# Patient Record
Sex: Female | Born: 2000 | Race: Black or African American | Hispanic: No | Marital: Single | Smoking: Never smoker
Health system: Southern US, Community
[De-identification: ages and names within clinical notes are randomized; demographics above are authoritative.]

## PROBLEM LIST (undated history)

## (undated) ENCOUNTER — Inpatient Hospital Stay (HOSPITAL_COMMUNITY): Payer: Self-pay

## (undated) DIAGNOSIS — F419 Anxiety disorder, unspecified: Secondary | ICD-10-CM

## (undated) DIAGNOSIS — F32A Depression, unspecified: Secondary | ICD-10-CM

## (undated) DIAGNOSIS — D649 Anemia, unspecified: Secondary | ICD-10-CM

## (undated) DIAGNOSIS — F909 Attention-deficit hyperactivity disorder, unspecified type: Secondary | ICD-10-CM

## (undated) HISTORY — DX: Depression, unspecified: F32.A

## (undated) HISTORY — DX: Anxiety disorder, unspecified: F41.9

## (undated) HISTORY — PX: NO PAST SURGERIES: SHX2092

---
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
## 2021-01-02 ENCOUNTER — Encounter: Payer: Self-pay | Admitting: *Deleted

## 2021-01-02 DIAGNOSIS — Z3401 Encounter for supervision of normal first pregnancy, first trimester: Secondary | ICD-10-CM

## 2021-01-09 ENCOUNTER — Encounter: Payer: Self-pay | Admitting: *Deleted

## 2021-01-09 DIAGNOSIS — Z3401 Encounter for supervision of normal first pregnancy, first trimester: Secondary | ICD-10-CM

## 2021-01-24 ENCOUNTER — Telehealth: Payer: Self-pay | Admitting: *Deleted

## 2021-01-24 ENCOUNTER — Other Ambulatory Visit: Payer: Self-pay

## 2021-01-24 ENCOUNTER — Ambulatory Visit (INDEPENDENT_AMBULATORY_CARE_PROVIDER_SITE_OTHER): Payer: Medicaid Other | Admitting: Advanced Practice Midwife

## 2021-01-24 VITALS — BP 123/73 | HR 101 | Wt 130.0 lb

## 2021-01-24 DIAGNOSIS — Z3A17 17 weeks gestation of pregnancy: Secondary | ICD-10-CM

## 2021-01-24 DIAGNOSIS — Z3A19 19 weeks gestation of pregnancy: Secondary | ICD-10-CM

## 2021-01-24 DIAGNOSIS — Z3401 Encounter for supervision of normal first pregnancy, first trimester: Secondary | ICD-10-CM | POA: Diagnosis not present

## 2021-01-24 DIAGNOSIS — Z609 Problem related to social environment, unspecified: Secondary | ICD-10-CM

## 2021-01-24 NOTE — Telephone Encounter (Signed)
Returned call from 12:14 PM, office at lunch. Left a message with appointment information.
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
## 2021-01-25 ENCOUNTER — Ambulatory Visit (INDEPENDENT_AMBULATORY_CARE_PROVIDER_SITE_OTHER): Payer: Medicaid Other | Admitting: Licensed Clinical Social Worker

## 2021-01-25 DIAGNOSIS — Z609 Problem related to social environment, unspecified: Secondary | ICD-10-CM | POA: Diagnosis not present

## 2021-01-25 DIAGNOSIS — Z59819 Housing instability, housed unspecified: Secondary | ICD-10-CM | POA: Diagnosis not present
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
## 2021-02-13 ENCOUNTER — Other Ambulatory Visit: Payer: Self-pay | Admitting: Advanced Practice Midwife

## 2021-02-13 ENCOUNTER — Other Ambulatory Visit: Payer: Self-pay | Admitting: *Deleted

## 2021-02-13 ENCOUNTER — Ambulatory Visit: Payer: Medicaid Other | Attending: Advanced Practice Midwife

## 2021-02-13 ENCOUNTER — Other Ambulatory Visit: Payer: Self-pay

## 2021-02-13 DIAGNOSIS — Z3A19 19 weeks gestation of pregnancy: Secondary | ICD-10-CM | POA: Insufficient documentation

## 2021-02-13 DIAGNOSIS — Z3401 Encounter for supervision of normal first pregnancy, first trimester: Secondary | ICD-10-CM | POA: Insufficient documentation

## 2021-02-13 DIAGNOSIS — Z362 Encounter for other antenatal screening follow-up: Secondary | ICD-10-CM

## 2021-02-13 DIAGNOSIS — O283 Abnormal ultrasonic finding on antenatal screening of mother: Secondary | ICD-10-CM

## 2021-02-21 ENCOUNTER — Ambulatory Visit (INDEPENDENT_AMBULATORY_CARE_PROVIDER_SITE_OTHER): Payer: Medicaid Other | Admitting: Obstetrics and Gynecology

## 2021-02-21 ENCOUNTER — Other Ambulatory Visit: Payer: Self-pay

## 2021-02-21 DIAGNOSIS — Z3401 Encounter for supervision of normal first pregnancy, first trimester: Secondary | ICD-10-CM | POA: Diagnosis not present

## 2021-02-21 DIAGNOSIS — Z3A21 21 weeks gestation of pregnancy: Secondary | ICD-10-CM | POA: Diagnosis not present

## 2021-02-21 MED ORDER — ONDANSETRON 8 MG PO TBDP: 8.0000 mg | ORAL_TABLET | Freq: Three times a day (TID) | ORAL | 0 refills | Status: DC | PRN

## 2021-02-21 NOTE — Progress Notes (Signed)
   PRENATAL VISIT NOTE  Subjective:  Melissa Navarro is a 20 y.o. G1P0 at [redacted]w[redacted]d being seen today for ongoing prenatal care.  She is currently monitored for the following issues for this high-risk pregnancy and has MDD (major depressive disorder), recurrent severe, without psychosis (HCC); Attention deficit hyperactivity disorder (ADHD); Impulsiveness; Mood disorder (HCC); Encounter for supervision of normal first pregnancy in first trimester; Borderline personality disorder (HCC); and Continuous cannabis use on their problem list.  Patient reports no complaints.  Contractions: Not present. Vag. Bleeding: None.  Movement: Present. Denies leaking of fluid.   The following portions of the patient's history were reviewed and updated as appropriate: allergies, current medications, past family history, past medical history, past social history, past surgical history and problem list.   Objective:   Vitals:   02/21/21 1454  BP: 110/64  Pulse: 80  Weight: 132 lb (59.9 kg)    Fetal Status: Fetal Heart Rate (bpm): 156   Movement: Present     General:  Alert, oriented and cooperative. Patient is in no acute distress.  Skin: Skin is warm and dry. No rash noted.   Cardiovascular: Normal heart rate noted  Respiratory: Normal respiratory effort, no problems with respiration noted  Abdomen: Soft, gravid, appropriate for gestational age.  Pain/Pressure: Absent     Pelvic: Cervical exam deferred        Extremities: Normal range of motion.  Edema: None  Mental Status: Normal mood and affect. Normal behavior. Normal judgment and thought content.   Assessment and Plan:  Pregnancy: G1P0 at [redacted]w[redacted]d 1. Encounter for supervision of normal first pregnancy in first trimester  - partner Myrtha Mantis present today. Strong marijuana smell in room. Back and fourth "bickering" between partner and patient throughout visit.  - Alpha fetoprotein, maternal - Lives with 'home girl" in [place]. She Is not planning to stay  there until baby is born.  She is Planning to move to [place] to live with Aunt. Planning to leave today or tomorrow via train. She plans to start OB when she arrives to [place].  Preterm labor symptoms and general obstetric precautions including but not limited to vaginal bleeding, contractions, leaking of fluid and fetal movement were reviewed in detail with the patient. Please refer to After Visit Summary for other counseling recommendations.   No follow-ups on file.  Future Appointments  Date Time Provider Department Center  03/15/2021  3:30 PM [facility] NURSE [facility]  03/15/2021  3:45 PM [facility] US6 [facility]    Venia Carbon, NP

## 2021-02-22 LAB — ALPHA FETOPROTEIN, MATERNAL
AFP MoM: 1.08
AFP, Serum: 81.3 ng/mL
Calc'd Gestational Age: 21 weeks
Maternal Wt: 132 [lb_av]
Risk for ONTD: <1
Twins-AFP: 1

## 2021-03-15 ENCOUNTER — Ambulatory Visit: Payer: Medicaid Other

## 2021-03-15 ENCOUNTER — Ambulatory Visit: Payer: Medicaid Other | Attending: Obstetrics and Gynecology

## 2021-03-16 DIAGNOSIS — Z419 Encounter for procedure for purposes other than remedying health state, unspecified: Secondary | ICD-10-CM | POA: Diagnosis not present

## 2021-04-16 DIAGNOSIS — Z419 Encounter for procedure for purposes other than remedying health state, unspecified: Secondary | ICD-10-CM | POA: Diagnosis not present

## 2021-04-16 NOTE — L&D Delivery Note (Signed)
Delivery Note ?At 8:07 AM a viable and healthy female was delivered via Vaginal, Spontaneous (Presentation: Left Occiput Anterior).  APGAR: 8, 9; weight 6 lb 2.1 oz (2780 g).   ?Placenta status: Spontaneous, Intact.  Cord: 3 vessels with the following complications: None.   ? ?Anesthesia: None ?Episiotomy: None ?Lacerations: Labial, superficial, hemostatic ?Suture Repair:  n/a ?Est. Blood Loss (mL): 250 ? ?Mom to postpartum.  Baby to Couplet care / Skin to Skin. ? ?Melissa Navarro is a 21 y.o. female G1P1001 with IUP at [redacted]w[redacted]d admitted for active labor .  She progressed with AROM only for augmentation to complete and pushed ~ 20 minutes to deliver.  Cord clamping delayed by 1-3 minutes then clamped by CNM and cut by FOB.  Placenta intact and spontaneous, EBL normal but boggy uterus with increased bleeding initially so pt received Cytotec 800 mcg rectally and TXA via IV in addition to routine Pitocin IV.  Very superficial lacerations on bilateral labia, hemostatic, approximate well and not repaired.  Mom and baby stable prior to transfer to postpartum. She plans on breastfeeding. She requests IUD or Depo for birth control.  ? ?Sharen Counter ?06/29/2021, 9:28 AM ? ? ? ?

## 2021-05-01 DIAGNOSIS — O99891 Other specified diseases and conditions complicating pregnancy: Secondary | ICD-10-CM | POA: Diagnosis not present

## 2021-05-01 DIAGNOSIS — R55 Syncope and collapse: Secondary | ICD-10-CM | POA: Diagnosis not present

## 2021-05-01 DIAGNOSIS — O99323 Drug use complicating pregnancy, third trimester: Secondary | ICD-10-CM | POA: Diagnosis not present

## 2021-05-01 DIAGNOSIS — F1290 Cannabis use, unspecified, uncomplicated: Secondary | ICD-10-CM | POA: Diagnosis not present

## 2021-05-01 DIAGNOSIS — R42 Dizziness and giddiness: Secondary | ICD-10-CM | POA: Diagnosis not present

## 2021-05-01 DIAGNOSIS — O2343 Unspecified infection of urinary tract in pregnancy, third trimester: Secondary | ICD-10-CM | POA: Diagnosis not present

## 2021-05-01 DIAGNOSIS — Z3A31 31 weeks gestation of pregnancy: Secondary | ICD-10-CM | POA: Diagnosis not present

## 2021-05-02 ENCOUNTER — Telehealth: Payer: Self-pay

## 2021-05-02 NOTE — Telephone Encounter (Signed)
Transition Care Management Follow-up Telephone Call Date of discharge and from where: 05/01/2021 from [facility] How have you been since you were released from the hospital? Pt stated that she is feeling well.  Any questions or concerns? No  Patient has moved to [place].  Patient stated that she needs her records sent to new provider.  Gave patient HIM number: [phone]  Fax: [phone]

## 2021-05-03 ENCOUNTER — Telehealth: Payer: Self-pay | Admitting: *Deleted

## 2021-05-03 DIAGNOSIS — Z130 Encounter for screening for diseases of the blood and blood-forming organs and certain disorders involving the immune mechanism: Secondary | ICD-10-CM | POA: Diagnosis not present

## 2021-05-03 DIAGNOSIS — Z3480 Encounter for supervision of other normal pregnancy, unspecified trimester: Secondary | ICD-10-CM | POA: Diagnosis not present

## 2021-05-03 DIAGNOSIS — R030 Elevated blood-pressure reading, without diagnosis of hypertension: Secondary | ICD-10-CM | POA: Diagnosis not present

## 2021-05-03 NOTE — Telephone Encounter (Signed)
Left patient an urgent message to call the office to give updated information. Patient will be seen by the [facility] Department today, 05/03/2021.

## 2021-05-12 DIAGNOSIS — Z23 Encounter for immunization: Secondary | ICD-10-CM | POA: Diagnosis not present

## 2021-05-12 DIAGNOSIS — Z1389 Encounter for screening for other disorder: Secondary | ICD-10-CM | POA: Diagnosis not present

## 2021-05-12 DIAGNOSIS — O99343 Other mental disorders complicating pregnancy, third trimester: Secondary | ICD-10-CM | POA: Diagnosis not present

## 2021-05-12 DIAGNOSIS — Z3403 Encounter for supervision of normal first pregnancy, third trimester: Secondary | ICD-10-CM | POA: Diagnosis not present

## 2021-05-17 DIAGNOSIS — Z419 Encounter for procedure for purposes other than remedying health state, unspecified: Secondary | ICD-10-CM | POA: Diagnosis not present

## 2021-06-06 ENCOUNTER — Other Ambulatory Visit (HOSPITAL_COMMUNITY)
Admission: RE | Admit: 2021-06-06 | Discharge: 2021-06-06 | Disposition: A | Discharge status: Home / Self Care | Payer: Medicaid Other | Source: Ambulatory Visit | Attending: Advanced Practice Midwife | Admitting: Advanced Practice Midwife

## 2021-06-06 ENCOUNTER — Ambulatory Visit (INDEPENDENT_AMBULATORY_CARE_PROVIDER_SITE_OTHER): Payer: Medicaid Other | Admitting: Advanced Practice Midwife

## 2021-06-06 ENCOUNTER — Other Ambulatory Visit: Payer: Self-pay

## 2021-06-06 VITALS — BP 104/65 | HR 76 | Wt 157.0 lb

## 2021-06-06 DIAGNOSIS — Z3A36 36 weeks gestation of pregnancy: Secondary | ICD-10-CM

## 2021-06-06 DIAGNOSIS — Z3401 Encounter for supervision of normal first pregnancy, first trimester: Secondary | ICD-10-CM | POA: Diagnosis not present

## 2021-06-06 DIAGNOSIS — Z609 Problem related to social environment, unspecified: Secondary | ICD-10-CM

## 2021-06-06 LAB — OB RESULTS CONSOLE GBS: GBS: NEGATIVE

## 2021-06-06 NOTE — Patient Instructions (Signed)
Labor Precautions Reasons to come to [facility]U at [facility]:  1.  Contractions are  5 minutes apart or less, each last 1 minute, these have been going on for 1-2 hours, and you cannot walk or talk during them 2.  You have a large gush of fluid, or a trickle of fluid that will not stop and you have to wear a pad 3.  You have bleeding that is bright red, heavier than spotting--like menstrual bleeding (spotting can be normal in early labor or after a check of your cervix) 4.  You do not feel the baby moving like he/she normally does  

## 2021-06-06 NOTE — Progress Notes (Signed)
° ° °  PRENATAL VISIT NOTE  Subjective:  Melissa Navarro is a 21 y.o. G1P0 at [redacted]w[redacted]d being seen today for ongoing prenatal care.  She is currently monitored for the following issues for this low-risk pregnancy and has MDD (major depressive disorder), recurrent severe, without psychosis ([place]); Attention deficit hyperactivity disorder (ADHD); Impulsiveness; Mood disorder ([place]); Encounter for supervision of normal first pregnancy in first trimester; Borderline personality disorder ([place]); and Continuous cannabis use on their problem list.  Patient reports occasional contractions.  Contractions: Irritability. Vag. Bleeding: None.  Movement: Present. Denies leaking of fluid.   The following portions of the patient's history were reviewed and updated as appropriate: allergies, current medications, past family history, past medical history, past social history, past surgical history and problem list.   Objective:   Vitals:   06/06/21 1552  BP: 104/65  Pulse: 76  Weight: 157 lb (71.2 kg)    Fetal Status: Fetal Heart Rate (bpm): 140 Fundal Height: 35 cm Movement: Present  Presentation: Vertex  General:  Alert, oriented and cooperative. Patient is in no acute distress.  Skin: Skin is warm and dry. No rash noted.   Cardiovascular: Normal heart rate noted  Respiratory: Normal respiratory effort, no problems with respiration noted  Abdomen: Soft, gravid, appropriate for gestational age.  Pain/Pressure: Present     Pelvic: Cervical exam performed in the presence of a chaperone Dilation: Closed Effacement (%): 80 Station: -2  Extremities: Normal range of motion.  Edema: Trace  Mental Status: Normal mood and affect. Normal behavior. Normal judgment and thought content.   Assessment and Plan:  Pregnancy: G1P0 at [redacted]w[redacted]d 1. Encounter for supervision of normal first pregnancy in first trimester --Anticipatory guidance about next visits/weeks of pregnancy given.  --Next visit in 1 week  --GBS/GCC collected  today  2. High risk social situation --Pt reports being safe where she lives now, here with her mother for appt  3. [redacted] weeks gestation of pregnancy   Term labor symptoms and general obstetric precautions including but not limited to vaginal bleeding, contractions, leaking of fluid and fetal movement were reviewed in detail with the patient. Please refer to After Visit Summary for other counseling recommendations.   Return in about 1 week (around 06/13/2021).  Future Appointments  Date Time Provider [place]  06/13/2021  4:10 PM Constant, Vickii Chafe, MD [facility]     Fatima Blank, CNM

## 2021-06-07 ENCOUNTER — Telehealth: Payer: Self-pay | Admitting: Advanced Practice Midwife

## 2021-06-07 DIAGNOSIS — A568 Sexually transmitted chlamydial infection of other sites: Secondary | ICD-10-CM

## 2021-06-07 LAB — CERVICOVAGINAL ANCILLARY ONLY
Chlamydia: POSITIVE — AB
Comment: NEGATIVE
Comment: NORMAL
Neisseria Gonorrhea: NEGATIVE

## 2021-06-07 MED ORDER — AZITHROMYCIN 500 MG PO TABS: 1000.0000 mg | ORAL_TABLET | Freq: Once | ORAL | 1 refills | Status: AC

## 2021-06-07 NOTE — Telephone Encounter (Signed)
Called pt to discuss positive vaginal swab for chlamydia on 06/06/21.  Questions answered.  Offered partner therapy, pt will need to pick up in the office if desired.  Rx for azithromycin 1000 mg x 1 [facility]  TOC in 3 weeks.

## 2021-06-09 LAB — CULTURE, BETA STREP (GROUP B ONLY)
MICRO NUMBER:: 13040543
SPECIMEN QUALITY:: ADEQUATE

## 2021-06-11 ENCOUNTER — Encounter: Payer: Self-pay | Admitting: *Deleted

## 2021-06-13 ENCOUNTER — Encounter: Payer: Self-pay | Admitting: Obstetrics and Gynecology

## 2021-06-13 ENCOUNTER — Ambulatory Visit (INDEPENDENT_AMBULATORY_CARE_PROVIDER_SITE_OTHER): Payer: Medicaid Other | Admitting: Obstetrics and Gynecology

## 2021-06-13 ENCOUNTER — Other Ambulatory Visit: Payer: Self-pay

## 2021-06-13 VITALS — BP 108/66 | HR 85 | Wt 158.0 lb

## 2021-06-13 DIAGNOSIS — O98813 Other maternal infectious and parasitic diseases complicating pregnancy, third trimester: Secondary | ICD-10-CM

## 2021-06-13 DIAGNOSIS — A749 Chlamydial infection, unspecified: Secondary | ICD-10-CM

## 2021-06-13 DIAGNOSIS — Z3401 Encounter for supervision of normal first pregnancy, first trimester: Secondary | ICD-10-CM

## 2021-06-13 NOTE — Patient Instructions (Signed)
AREA PEDIATRIC/FAMILY PRACTICE PHYSICIANS  [place] ([address])  Family Medicine Center Chambliss, MD; Eniola, MD; Hale, MD; Hensel, MD; McDiarmid, MD; McIntyer, MD; Neal, MD; Walden, MD [address] [place], [place] [address] [phone] Mon-Fri 8:30-12:30, 1:30-5:00 Providers come to see babies at [facility] Accepting Medicaid [facility] providers who accept newborns: Koirala, MD; Morrow, MD; Wolters, MD [address], [place], [place] [address] [phone] Mon-Fri 8:00-5:30 Babies seen by providers at [facility] Does NOT accept Medicaid Please call early in hospitalization for appointment (limited availability)  [facility] Mulberry, MD [address] [place], [place] [address] [phone] Mon, Tue, Thur, Fri 8:30-5:00, Wed 10:00-7:00 (closed 1-2pm) Babies seen by [facility] providers Accepting Medicaid Rub[facility] - Pediatrician Rubin, MD [address], [place], [place] [address] [phone] Mon-Fri 8:30-5:00, Sat 8:30-12:00 Provider comes to see babies at [facility] Accepting Medicaid Must have been referred from current patients or contacted office prior to delivery [facility] ([facility]) Brown, MD; Chandler, MD; Ettefagh, MD; Grant, MD; Lester, MD; McCormick, MD; McQueen, MD; Prose, MD; Simha, MD; Stanley, MD; Stryffeler, NP; Tebben, NP [address], [place], [place] [address] [phone] Mon, Tue, Thur, Fri 8:30-5:30, Wed 9:30-5:30, Sat 8:30-12:30 Babies seen by [facility] providers Accepting Medicaid Only accepting infants of first-time parents or siblings of current patients Hospital discharge coordinator will make follow-up appointment Jack Amos [address], [place], [place]  [address] [phone]   Fax - [phone] [facility] [address], [place], [place]  [address] Phone - [phone]   Fax - [phone] Shilpa Gosrani [address], [place], [place]  [address] [phone]  [place] ([address]) [facility] Bates, MD; [place], MD; Cooper, Cox, MD; MD; Davis, MD; Dovico, MD; Ettefaugh, MD; Little, MD; Lowe, MD; Keiffer, MD; Melvin, MD; Sumner, MD; Williams, MD [address], [place], [place] [address] [phone] Mon-Fri 8:30-5:00 (extended evenings Mon-Thur as needed), Sat-Sun 10:00-1:00 Providers come to see babies at [facility] Accepting Medicaid for families of first-time babies and families with all children in the household age 3 and under. Must register with office prior to making appointment (M-F only). [facility] Henson, NP; Knapp, MD; Lalonde, MD; Tysinger, PA [address] [place], [place] [address] [phone] Mon-Fri 8:00-5:00 Babies seen by providers at [facility] Does NOT accept Medicaid/Commercial Insurance Only [facility] ([facility])  Artis, MD; Barnes, MD; Bratton, MD; Coccaro, MD; Lock[place] Gardner, MD; Kramer, MD; Marshall, MD; Netherton, MD; Poleto, MD; Skinner, MD [address] [place], [place] [address] [phone] Mon-Fri 8:30-5:30, Sat (Oct.-Mar.) 9:00-1:00 Babies seen by providers at [facility] Accepting Medicaid  [place] ([address]) [facility] Reid, MD; Warner, MD [address], [place], [place] [address] [phone] Mon-Fri 8:30-5:00, Sat 8:30-12:00 Providers come to see babies at [facility] Does NOT accept Medicaid [facility] Becker, PA; Hagler, MD; Scifres, PA; Sun, MD; Swayne, MD [address], , [place] [address] [phone] Mon-Fri 8:00-5:00 Babies seen by providers at [facility] Does NOT accept Medicaid Only accepting babies of parents who  are patients Please call early in hospitalization for appointment (limited availability)  Pediatricians Clark, MD; Frye, MD; Kelleher, MD; Mack, NP; Miller, MD; O'Keller, MD; Patterson, NP; Pudlo, MD; Puzio, MD; Thomas, MD; Tucker, MD; Twiselton, MD [address], [place], [place] [address] [phone] Mon-Fri 8:00-5:00, Sat 9:00-12:00 Providers come to see babies at [facility] Does NOT accept Medicaid  [place] ([address]) [facility] providers accepting new patients: Brake, NP; Wharton, PA [address], [place], [place] [address] [phone] Mon-Fri 8:00-5:00 Babies seen by providers at [facility] Does NOT accept Medicaid Only accepting babies of parents who are patients Please call early in hospitalization for appointment (limited availability) [facility] Gay, MD; Quinlan, MD [address] [place], [place] [address] [phone] (press 1 to schedule appointment) Mon-Fri 8:00-5:00 Providers come to see babies at [facility] Does NOT accept Medicaid [facility] Mazer, MD [address] [place], [place] [address] [phone] Mon-Fri 8:30-5:00 (lunch 12:30-1:00), extended hours by appointment only Wed 5:00-6:30 Babies seen by [facility] providers Accepting Medicaid [facility] Banks, MD; Jordan, MD; Koberlein, MD [address], [place], [place] [address] [phone] Mon-Fri 8:00-5:00 Babies seen by [facility] providers Does NOT accept Medicaid [facility] Parker, MD; Hunter, MD; Wallace, DO [address] [place], [place] [address] [phone] Mon-Fri 8:00-5:00 Babies seen by [facility] providers Does NOT accept Medicaid [facility] Brandon, PA; Brecken, PA; Christy, NP; Dees, MD; DeClaire, MD; DeWeese, MD; Hansen, NP; Mills, NP; Parrish, NP; Smoot, NP; Summer, MD; Vapne,  MD [address] [place], [place] [address] [phone] Mon-Fri 8:30-5:00, Sat 10:00-1:00 Providers come to see babies at [facility] Does NOT accept Medicaid Free prenatal information session Tuesdays at 4:45pm [facility] Bouska, MD; Gordon, PA; Jeffery, PA; Weber, PA [address] [place] [address] [phone] Mon-Fri 7:30-5:30 Babies seen by [facility] providers [facility] [address], [place], [place]  [address] [phone]   Fax - [phone]  [place] ([address] & [address]) [facility] [place] [address] [place], [place] [address] [phone] Mon-Thur 8:00-6:00 Providers come to see babies at [facility] Accepting Medicaid [facility] Anderson, NP; Badger, MD; Beal, PA; Spencer, PA [address] [place], [place] [address] [phone] Mon-Thur 7:30-7:30, Fri 7:30-4:30 Babies seen by [facility] providers Accepting Medicaid [facility] Agbuya, MD; Klett, NP; Romgoolam, MD [address], [place], [place] [address] [phone] Mon-Fri 8:30-5:00, Sat 8:30-12:00 Providers come to see babies at [facility] Accepting Medicaid Must have "Meet & Greet" appointment at office prior to delivery [facility] ([facility]) McCord, MD; Wallace, MD; Wood, MD [address], [place], [place] [address] [phone] Mon-Wed 8:00-6:00, Thur-Fri 8:00-5:00, Sat 9:00-12:00 Providers come to see babies at [facility] Does NOT accept Medicaid Only accepting siblings of current patients [facility]  [address], [place], [place]  [address] [phone]   Fax - [phone] [facility] [place] [address], [place],   [address] [phone]   Fax -  [phone]  [place] ([address] & [address]) [facility] Cirigliano, DO; Matthews, DO [address] [place],  [address] [phone] Mon-Fri 7:00-5:00 Babies seen by [facility] providers Does NOT accept Medicaid [facility] Briscoe, MD; Howley, PA; Moreira, PA [address], [place],  [address] [phone] Mon-Fri 8:00-5:00 Babies seen by [facility] providers Accepting Medicaid [facility] Boyd, MD; Church, PA; Jones, NP; Osborn, PA [address], ,  [address] [phone] Mon-Fri 8:00-5:00 Babies seen by providers at [facility] Accepting Medicaid  [place] ([address]) [facility] [place] Wendling, DO [address] [place], [place] [address] [phone] Mon-Fri 8:00-5:00 Babies seen by [facility] providers Does NOT accept Medicaid Limited availability, please call early in hospitalization to schedule follow-up [facility] Calderon, PA; Cummings, MD; Dillard, MD; Martin, PA; Olson, MD; VanDeven, PA [address], [place], [place] [address] [phone] Mon-Fri 8:30-5:00, Sat 9:00-12:00 Babies seen by providers at [facility] Accepting Medicaid Please register online then schedule online or call office [email or web address] Hunter, NP; Kumar, MD; Martin Rogers, PA [address], [place], [place] [address] [phone] Mon-Fri 8:00-5:00 Babies seen by providers at [facility] Accepting Medicaid [facility] ([facility]) [place], MD; Kristi Fleenor, NP; West, MD [address], [place], [place] [address] [phone] Mon-Fri 8:00-5:30, Sat&Sun by appointment (phones open at  8:30) Babies seen by [facility] providers Accepting Medicaid Must be a first-time baby or sibling of current patient [facility] [place]  [address], [place], [place]  [address] [phone]   Fax - [phone]  [place] ([address]) [facility] Brown, PA; Cowen, PA; Rice, MD; Helton, PA; Spry, MD [address] [place], [place] [address] [phone] Mon-Thur 8:00-7:00, Fri 8:00-5:00, Sat 8:00-12:00, Sun 9:00-12:00 Babies seen by [facility] providers Accepting Medicaid [facility] Coe-Goins, MD; Marshall, MD; Pierre-Louis, MD [address], [place], [place] [address] [phone] Mon-Thur 8:00-5:00 Babies seen by providers at [facility] Accepting Medicaid [facility] Bratton, MD; Coe-Goins, MD; Hayes, MD; Lewis, MD; List, MD; Lott, MD; Marshall, MD; Moran, MD; O'Neal, MD; Pierre-Louis, MD; Pitonzo, MD; Scholer, MD; Spangle, MD [address] [place], [place] [address] [phone] Mon-Fri 8:00-5:30, Sat (Oct.-Mar.) 9:00-1:00 Babies seen by providers at [facility] Accepting Medicaid Must fill out new patient packet, available online at [email or web address] [facility] ([facility]) Friddle, NP; Harris, NP; Kelly, NP; Logan, MD; Melvin, PA; Poth, MD; Ramadoss, MD; Stanton, NP [address], [place], [place] [address] [phone] Mon-Thur 8:00-5:30, Fri 8:00-5:00 Babies seen by providers at [facility] Accepting Medicaid  [place] ([address]) [facility] Dixon, PA; [place], MD; Pickard, MD; Tapia, PA [address], [place], [place] [address] [phone] Mon-Fri 8:00-5:00 Babies seen by providers at [facility] Accepting Medicaid   [place] ([address]) [facility] [place] Masneri, DO; Meyers, MD; Nelson, PA [address], [place], [place] [address] [phone] Mon-Fri 8:00-5:00 Babies seen by providers at [facility] Does NOT accept Medicaid Limited appointment availability, please call early in hospitalization  [facility] [place] Kunedd, DO; McGowen, MD [address], [place], [place] [address] [phone] Mon-Fri 8:00-5:00 Babies seen by [facility] providers Does NOT accept Medicaid [facility] [place] Cameron, MD; MacDonald, MD; Michaels, PA; Nayak, MD [address] [place] [address], [place], [place] [address] [phone] Mon-Fri 8:00-5:00 After hours clinic ([address] [place], [place] [address]) [phone] Mon-Fri 5:00-8:00, Sat 12:00-6:00, Sun 10:00-4:00 Babies seen by [facility] providers Accepting Medicaid [facility] [place] [address], [place], [place]  [address] [phone]   Fax - [phone]  [place] ([address]) [facility] [place] [facility] Andy, MD [address], [place], [place] [address] [phone] Mon-Fri 8:00-5:00 Babies seen by [facility] providers Does NOT accept Medicaid [facility] [place] ([facility] [place]) Eksir, MD [address], [place], [place] [address] [phone] Mon-Thur 8:00-7:00, Fri 8:00-5:00, Sat 8:00-12:00 Babies seen by providers at [facility] Accepting Medicaid - but does not have vaccinations in office (must be received elsewhere) Limited availability, please call early in hospitalization  [place] ([address]) [facility]  Charlene Flemming, MD [address], [place] [address] [phone]  Fax [phone]  [facility]  Kimberly Newton, MD, Annamarie Streilein, PA, Carla Hampton, PA [address] [place] [place], [place]  [address] [phone] [facility]  [address], [place], [place] [address] [phone] [address], [place], [place] [address] [phone] (West Office)  [facility] [address], [place], [place] [address] [phone] [facility] [address] [place], [place], [place] [address] [phone] [facility] [address], [place], [place] [address] [phone] [facility] [address], [place], [place] [address] [phone] [facility] [address], [place], [place] [address] [phone] [facility] [address], [place], [place] [address] [phone] [facility]  [address], [place], [place] [address] [phone] Dr[facility] [address] [place] [address], [place], [place] [address] [phone] [facility] [address], [place], [place] [address] [phone] [facility] [address], ,  [address] [phone]  

## 2021-06-13 NOTE — Progress Notes (Signed)
° °  PRENATAL VISIT NOTE  Subjective:  Melissa Navarro is a 21 y.o. G1P0 at [redacted]w[redacted]d being seen today for ongoing prenatal care.  She is currently monitored for the following issues for this low-risk pregnancy and has MDD (major depressive disorder), recurrent severe, without psychosis ([place]); Attention deficit hyperactivity disorder (ADHD); Impulsiveness; Mood disorder ([place]); Encounter for supervision of normal first pregnancy in first trimester; Borderline personality disorder ([place]); Continuous cannabis use; and Chlamydia infection affecting pregnancy in third trimester, antepartum on their problem list.  Patient reports no complaints.  Contractions: Not present. Vag. Bleeding: None.  Movement: Present. Denies leaking of fluid.   The following portions of the patient's history were reviewed and updated as appropriate: allergies, current medications, past family history, past medical history, past social history, past surgical history and problem list.   Objective:   Vitals:   06/13/21 1556  BP: 108/66  Pulse: 85  Weight: 158 lb (71.7 kg)    Fetal Status: Fetal Heart Rate (bpm): 147 Fundal Height: 37 cm Movement: Present  Presentation: Vertex  General:  Alert, oriented and cooperative. Patient is in no acute distress.  Skin: Skin is warm and dry. No rash noted.   Cardiovascular: Normal heart rate noted  Respiratory: Normal respiratory effort, no problems with respiration noted  Abdomen: Soft, gravid, appropriate for gestational age.  Pain/Pressure: Absent     Pelvic: Cervical exam performed in the presence of a chaperone Dilation: Fingertip Effacement (%): 80 Station: -3  Extremities: Normal range of motion.  Edema: Trace  Mental Status: Normal mood and affect. Normal behavior. Normal judgment and thought content.   Assessment and Plan:  Pregnancy: G1P0 at [redacted]w[redacted]d 1. Encounter for supervision of normal first pregnancy in first trimester Patient is doing well without complaints Patient plans  nexplanon for contraception Patient is still researching pediatricians  2. Chlamydia infection affecting pregnancy in third trimester, antepartum Recent treatment on 06/08/21  Term labor symptoms and general obstetric precautions including but not limited to vaginal bleeding, contractions, leaking of fluid and fetal movement were reviewed in detail with the patient. Please refer to After Visit Summary for other counseling recommendations.   Return in about 1 week (around 06/20/2021) for in person, ROB, Low risk.  Future Appointments  Date Time Provider [place]  06/13/2021  4:10 PM Nithin Demeo, Vickii Chafe, MD [facility]    Mora Bellman, MD

## 2021-06-14 DIAGNOSIS — Z419 Encounter for procedure for purposes other than remedying health state, unspecified: Secondary | ICD-10-CM | POA: Diagnosis not present

## 2021-06-20 ENCOUNTER — Ambulatory Visit (INDEPENDENT_AMBULATORY_CARE_PROVIDER_SITE_OTHER): Payer: Medicaid Other | Admitting: Advanced Practice Midwife

## 2021-06-20 ENCOUNTER — Other Ambulatory Visit (HOSPITAL_COMMUNITY)
Admission: RE | Admit: 2021-06-20 | Discharge: 2021-06-20 | Disposition: A | Discharge status: Home / Self Care | Payer: Medicaid Other | Source: Ambulatory Visit | Attending: Advanced Practice Midwife | Admitting: Advanced Practice Midwife

## 2021-06-20 ENCOUNTER — Other Ambulatory Visit: Payer: Self-pay

## 2021-06-20 VITALS — BP 104/68 | HR 68 | Wt 157.0 lb

## 2021-06-20 DIAGNOSIS — O98813 Other maternal infectious and parasitic diseases complicating pregnancy, third trimester: Secondary | ICD-10-CM | POA: Insufficient documentation

## 2021-06-20 DIAGNOSIS — Z3401 Encounter for supervision of normal first pregnancy, first trimester: Secondary | ICD-10-CM

## 2021-06-20 DIAGNOSIS — O26843 Uterine size-date discrepancy, third trimester: Secondary | ICD-10-CM

## 2021-06-20 DIAGNOSIS — A749 Chlamydial infection, unspecified: Secondary | ICD-10-CM | POA: Diagnosis not present

## 2021-06-20 NOTE — Progress Notes (Signed)
? ?  PRENATAL VISIT NOTE ? ?Subjective:  ?Melissa Navarro is a 21 y.o. G1P0 at [redacted]w[redacted]d being seen today for ongoing prenatal care.  She is currently monitored for the following issues for this low-risk pregnancy and has MDD (major depressive disorder), recurrent severe, without psychosis ([facility]); Attention deficit hyperactivity disorder (ADHD); Impulsiveness; Mood disorder ([facility]); Encounter for supervision of normal first pregnancy in first trimester; Borderline personality disorder ([facility]); Continuous cannabis use; and Chlamydia infection affecting pregnancy in third trimester, antepartum on their problem list. ? ?Patient reports occasional contractions.  Contractions: Not present. Vag. Bleeding: None.  Movement: Present. Denies leaking of fluid.  ? ?The following portions of the patient's history were reviewed and updated as appropriate: allergies, current medications, past family history, past medical history, past social history, past surgical history and problem list.  ? ?Objective:  ? ?Vitals:  ? 06/20/21 1558  ?BP: 104/68  ?Pulse: 68  ?Weight: 157 lb (71.2 kg)  ? ? ?Fetal Status: Fetal Heart Rate (bpm): 141 Fundal Height: 36 cm Movement: Present  Presentation: Vertex ? ?General:  Alert, oriented and cooperative. Patient is in no acute distress.  ?Skin: Skin is warm and dry. No rash noted.   ?Cardiovascular: Normal heart rate noted  ?Respiratory: Normal respiratory effort, no problems with respiration noted  ?Abdomen: Soft, gravid, appropriate for gestational age.  Pain/Pressure: Absent     ?Pelvic: Cervical exam performed in the presence of a chaperone Dilation: 1 Effacement (%): 60 Station: -2  ?Extremities: Normal range of motion.  Edema: Trace  ?Mental Status: Normal mood and affect. Normal behavior. Normal judgment and thought content.  ? ?Assessment and Plan:  ?Pregnancy: G1P0 at [redacted]w[redacted]d ?1. Encounter for supervision of normal first pregnancy in first trimester ?--Anticipatory guidance about next visits/weeks of  pregnancy given.  ?--Next appt in 1 week ?--Labor readiness and labor precautions reviewed ? ?2. Chlamydia infection affecting pregnancy in third trimester, antepartum ?--TOC today ?- Cervicovaginal ancillary only( [place]) ? ? ?3. Uterine size date discrepancy pregnancy, third trimester ?--Initially FH measured at 33-34 cm but pt position adjusted and FH 36 cm obtained.  This is c/w [place] of 5-5.5 lb EFW.  Given slow increase in FH in 3 weeks, will order [place] for EFW, if able to obtain before delivery. ? ?- [place] MFM OB FOLLOW UP; Future  ? ?Term labor symptoms and general obstetric precautions including but not limited to vaginal bleeding, contractions, leaking of fluid and fetal movement were reviewed in detail with the patient. ?Please refer to After Visit Summary for other counseling recommendations.  ? ?No follow-ups on file. ? ?Future Appointments  ?Date Time Provider Department Center  ?06/27/2021  1:30 PM Rasch, Harolyn Rutherford, NP [facility]  ? ? ?Sharen Counter, CNM  ?

## 2021-06-22 ENCOUNTER — Ambulatory Visit: Payer: Medicaid Other

## 2021-06-22 LAB — CERVICOVAGINAL ANCILLARY ONLY
Chlamydia: POSITIVE — AB
Comment: NEGATIVE
Comment: NEGATIVE
Comment: NORMAL
Neisseria Gonorrhea: NEGATIVE
Trichomonas: NEGATIVE

## 2021-06-25 MED ORDER — AZITHROMYCIN 500 MG PO TABS
1000.0000 mg | ORAL_TABLET | Freq: Once | ORAL | 1 refills | Status: AC
Start: 2021-06-25 — End: 2021-06-25

## 2021-06-25 NOTE — Addendum Note (Signed)
Addended by: Sharen Counter A on: 06/25/2021 10:02 AM ? ? Modules accepted: Orders ? ?

## 2021-06-27 ENCOUNTER — Ambulatory Visit (INDEPENDENT_AMBULATORY_CARE_PROVIDER_SITE_OTHER): Payer: Medicaid Other | Admitting: Obstetrics and Gynecology

## 2021-06-27 ENCOUNTER — Other Ambulatory Visit: Payer: Self-pay

## 2021-06-27 VITALS — BP 107/69 | HR 87 | Wt 162.0 lb

## 2021-06-27 DIAGNOSIS — A749 Chlamydial infection, unspecified: Secondary | ICD-10-CM

## 2021-06-27 DIAGNOSIS — O98813 Other maternal infectious and parasitic diseases complicating pregnancy, third trimester: Secondary | ICD-10-CM

## 2021-06-27 DIAGNOSIS — Z3401 Encounter for supervision of normal first pregnancy, first trimester: Secondary | ICD-10-CM

## 2021-06-27 DIAGNOSIS — Z3A39 39 weeks gestation of pregnancy: Secondary | ICD-10-CM

## 2021-06-27 MED ORDER — AZITHROMYCIN 500 MG PO TABS: 2000.0000 mg | ORAL_TABLET | Freq: Once | ORAL | 0 refills | Status: AC

## 2021-06-27 NOTE — Progress Notes (Signed)
? ?  PRENATAL VISIT NOTE ? ?Subjective:  ?Melissa Navarro is a 21 y.o. G1P0 at [redacted]w[redacted]d being seen today for ongoing prenatal care.  She is currently monitored for the following issues for this low-risk pregnancy and has MDD (major depressive disorder), recurrent severe, without psychosis ([facility]); Attention deficit hyperactivity disorder (ADHD); Impulsiveness; Mood disorder ([facility]); Encounter for supervision of normal first pregnancy in first trimester; Borderline personality disorder ([facility]); Continuous cannabis use; and Chlamydia infection affecting pregnancy in third trimester, antepartum on their problem list. ? ?Patient reports no complaints.  Contractions: Not present. Vag. Bleeding: None.  Movement: Present. Denies leaking of fluid.  ? ?The following portions of the patient's history were reviewed and updated as appropriate: allergies, current medications, past family history, past medical history, past social history, past surgical history and problem list.  ? ?Objective:  ? ?Vitals:  ? 06/27/21 1326  ?BP: 107/69  ?Pulse: 87  ?Weight: 162 lb (73.5 kg)  ? ? ?Fetal Status: Fetal Heart Rate (bpm): 147   Movement: Present  Presentation: Vertex ? ?General:  Alert, oriented and cooperative. Patient is in no acute distress.  ?Skin: Skin is warm and dry. No rash noted.   ?Cardiovascular: Normal heart rate noted  ?Respiratory: Normal respiratory effort, no problems with respiration noted  ?Abdomen: Soft, gravid, appropriate for gestational age.  Pain/Pressure: Absent     ?Pelvic: Cervical exam deferred Dilation: 1 Effacement (%): 60 Station: -3  ?Extremities: Normal range of motion.  Edema: Trace  ?Mental Status: Normal mood and affect. Normal behavior. Normal judgment and thought content.  ? ?Assessment and Plan:  ?Pregnancy: G1P0 at [redacted]w[redacted]d ? ?1. Chlamydia infection affecting pregnancy in third trimester, antepartum   ?2. Encounter for supervision of normal first pregnancy in first trimester   ?  ?Membranes stripped per patient  request. This was done prior to realizing patient had a positive TOC.  ?Rx: Azithromycin- expedited partner treatment included. Partner here today and confirmed allergies. Patient reports she does not have mychart set up,  She had test of cure done after 2 weeks and it came back positive. She never got the message. She is requesting treatment today. This is her second does of Azithryomycin. TOC not done today and treatment was done instead. May have been a false positive because TOC was done early.  ?-No glucose testing found in chart- we do not have records from [place]. Patient reports she had this done, however it is not in the chart. Patient reports she did the 2 hour and it was normal.  ?-She signed a release of records again today.  ? ?Term labor symptoms and general obstetric precautions including but not limited to vaginal bleeding, contractions, leaking of fluid and fetal movement were reviewed in detail with the patient. ?Please refer to After Visit Summary for other counseling recommendations.  ? ?Return in about 1 week (around 07/04/2021), or next week for NST. ? ?Future Appointments  ?Date Time Provider Department Center  ?07/04/2021  3:10 PM Leftwich-Kirby, Wilmer Floor, CNM [facility]  ? ? ?Venia Carbon, NP  ?

## 2021-06-28 ENCOUNTER — Inpatient Hospital Stay (EMERGENCY_DEPARTMENT_HOSPITAL)
Admission: AD | Admit: 2021-06-28 | Discharge: 2021-06-28 | Disposition: A | Discharge status: Home / Self Care | Payer: Medicaid Other | Source: Home / Self Care | Attending: Obstetrics and Gynecology | Admitting: Obstetrics and Gynecology

## 2021-06-28 ENCOUNTER — Ambulatory Visit: Payer: Medicaid Other | Attending: Advanced Practice Midwife

## 2021-06-28 ENCOUNTER — Encounter (HOSPITAL_COMMUNITY): Payer: Self-pay | Admitting: Obstetrics and Gynecology

## 2021-06-28 ENCOUNTER — Inpatient Hospital Stay: Payer: Medicaid Other

## 2021-06-28 DIAGNOSIS — O471 False labor at or after 37 completed weeks of gestation: Secondary | ICD-10-CM | POA: Diagnosis not present

## 2021-06-28 DIAGNOSIS — A749 Chlamydial infection, unspecified: Secondary | ICD-10-CM

## 2021-06-28 DIAGNOSIS — Z3A39 39 weeks gestation of pregnancy: Secondary | ICD-10-CM | POA: Insufficient documentation

## 2021-06-28 DIAGNOSIS — Z3689 Encounter for other specified antenatal screening: Secondary | ICD-10-CM

## 2021-06-28 MED ORDER — AZITHROMYCIN 250 MG PO TABS
1000.0000 mg | ORAL_TABLET | Freq: Once | ORAL | Status: AC
Start: 2021-06-28 — End: 2021-06-28
  Administered 2021-06-28: 1000 mg via ORAL
  Filled 2021-06-28: qty 4

## 2021-06-28 MED ORDER — BUTORPHANOL TARTRATE 1 MG/ML IJ SOLN
1.0000 mg | Freq: Once | INTRAMUSCULAR | Status: DC
  Filled 2021-06-28: qty 1

## 2021-06-28 MED ORDER — ONDANSETRON 4 MG PO TBDP
8.0000 mg | ORAL_TABLET | Freq: Once | ORAL | Status: AC
Start: 2021-06-28 — End: 2021-06-28
  Administered 2021-06-28: 8 mg via ORAL
  Filled 2021-06-28: qty 2

## 2021-06-28 MED ORDER — PROMETHAZINE HCL 25 MG PO TABS
25.0000 mg | ORAL_TABLET | Freq: Once | ORAL | Status: AC
  Administered 2021-06-28: 25 mg via ORAL
  Filled 2021-06-28: qty 1

## 2021-06-28 MED ORDER — BUTORPHANOL TARTRATE 1 MG/ML IJ SOLN
1.0000 mg | Freq: Once | INTRAMUSCULAR | Status: AC
  Administered 2021-06-28: 1 mg via INTRAMUSCULAR

## 2021-06-28 NOTE — MAU Note (Signed)
.  Melissa Navarro is a 21 y.o. at [redacted]w[redacted]d here in MAU reporting: ctx that started at 0000 this morning, now 5 minutes apart. Had membrane sweep yesterday in office. Having bloody show. Denies LOF. +FM.  ? ?Pain score: 10 ?Vitals:  ? 06/28/21 0821  ?BP: 119/66  ?Pulse: 95  ?Resp: 16  ?Temp: 98.2 ?F (36.8 ?C)  ?SpO2: 100%  ?   ?FHT:147 ?Lab orders placed from triage:  MAU labor standing orders ?

## 2021-06-28 NOTE — MAU Provider Note (Addendum)
S: Ms. Melissa Navarro is a 21 y.o. G1P0 at [redacted]w[redacted]d  who presents to MAU today complaining contractions q 5 minutes since 0000. She denies vaginal bleeding. She denies LOF. She reports normal fetal movement.   ? ?O: BP 120/65   Pulse 85   Temp 98.2 ?F (36.8 ?C) (Oral)   Resp 16   LMP 10/17/2020   SpO2 100%  ?GENERAL: Well-developed, well-nourished female in no acute distress.  ?HEAD: Normocephalic, atraumatic.  ?CHEST: Normal effort of breathing, regular heart rate ?ABDOMEN: Soft, nontender, gravid ? ?Cervical exam (x2):  ?Dilation: 2 ?Effacement (%): 80 ?Cervical Position: Middle ?Station: -2 ?Presentation: Vertex ?Exam by:: Imagene Sheller RN ? ?Fetal Monitoring: reactive ?Baseline: 145 ?Variability: moderate ?Accelerations: 15x15 ?Decelerations: none ?Contractions: q2-42min ? ?MAU Course/MDM: ?On recheck with no cervical change, pt initially agreed to discharge but then changed her mind and requested to stay with recheck in an hour. ? ?Recheck was the same, pt amenable to leaving but requested something for pain. Ordered therapeutic rest (stadol+phenergan) which was given and well tolerated. ? ?A: ?SIUP at [redacted]w[redacted]d  ?Latent labor ?NST reactive ? ?P: ?Discharge home in stable condition with term labor precautions ?Follow up at [facility] as scheduled for ongoing prenatal care or return to MAU when contractions stronger  ? ?Bernerd Limbo, CNM ?06/28/2021 10:00 AM ? ?

## 2021-06-28 NOTE — MAU Note (Signed)
Options discussed with patient to stay for another hour and be rechecked to go home to labor due to no change in cervical exam. Patient desires to go home and strict return precautions were given.  ?

## 2021-06-28 NOTE — MAU Note (Signed)
Patient called out and decided to stay for recheck.  ?

## 2021-06-29 ENCOUNTER — Other Ambulatory Visit: Payer: Self-pay

## 2021-06-29 ENCOUNTER — Encounter (HOSPITAL_COMMUNITY): Payer: Self-pay | Admitting: Obstetrics & Gynecology

## 2021-06-29 ENCOUNTER — Inpatient Hospital Stay (HOSPITAL_COMMUNITY)
Admission: AD | Admit: 2021-06-29 | Discharge: 2021-06-30 | DRG: 807 | Disposition: A | Discharge status: Home / Self Care | Payer: Medicaid Other | Attending: Family Medicine | Admitting: Family Medicine

## 2021-06-29 ENCOUNTER — Telehealth: Payer: Self-pay | Admitting: *Deleted

## 2021-06-29 DIAGNOSIS — Z30017 Encounter for initial prescription of implantable subdermal contraceptive: Secondary | ICD-10-CM | POA: Diagnosis not present

## 2021-06-29 DIAGNOSIS — O26893 Other specified pregnancy related conditions, third trimester: Secondary | ICD-10-CM | POA: Diagnosis not present

## 2021-06-29 DIAGNOSIS — Z3A39 39 weeks gestation of pregnancy: Secondary | ICD-10-CM | POA: Diagnosis not present

## 2021-06-29 DIAGNOSIS — F1210 Cannabis abuse, uncomplicated: Secondary | ICD-10-CM | POA: Diagnosis present

## 2021-06-29 DIAGNOSIS — R52 Pain, unspecified: Secondary | ICD-10-CM | POA: Diagnosis not present

## 2021-06-29 DIAGNOSIS — Z743 Need for continuous supervision: Secondary | ICD-10-CM | POA: Diagnosis not present

## 2021-06-29 DIAGNOSIS — O99324 Drug use complicating childbirth: Secondary | ICD-10-CM | POA: Diagnosis not present

## 2021-06-29 LAB — TYPE AND SCREEN
ABO/RH(D): O POS
Antibody Screen: NEGATIVE

## 2021-06-29 LAB — POCT FERN TEST: POCT Fern Test: NEGATIVE

## 2021-06-29 LAB — CBC
HCT: 35.2 % — ABNORMAL LOW (ref 36.0–46.0)
Hemoglobin: 12.3 g/dL (ref 12.0–15.0)
MCH: 30.4 pg (ref 26.0–34.0)
MCHC: 34.9 g/dL (ref 30.0–36.0)
MCV: 87.1 fL (ref 80.0–100.0)
Platelets: 240 10*3/uL (ref 150–400)
RBC: 4.04 MIL/uL (ref 3.87–5.11)
RDW: 12.6 % (ref 11.5–15.5)
WBC: 16.8 10*3/uL — ABNORMAL HIGH (ref 4.0–10.5)
nRBC: 0 % (ref 0.0–0.2)

## 2021-06-29 LAB — RPR: RPR Ser Ql: NONREACTIVE

## 2021-06-29 MED ORDER — ZOLPIDEM TARTRATE 5 MG PO TABS: 5.0000 mg | ORAL_TABLET | Freq: Every evening | ORAL | Status: DC | PRN

## 2021-06-29 MED ORDER — SENNOSIDES-DOCUSATE SODIUM 8.6-50 MG PO TABS
2.0000 | ORAL_TABLET | Freq: Every day | ORAL | Status: DC
  Filled 2021-06-29: qty 2

## 2021-06-29 MED ORDER — TRANEXAMIC ACID-NACL 1000-0.7 MG/100ML-% IV SOLN
INTRAVENOUS | Status: AC
  Filled 2021-06-29: qty 100

## 2021-06-29 MED ORDER — IBUPROFEN 600 MG PO TABS
600.0000 mg | ORAL_TABLET | Freq: Four times a day (QID) | ORAL | Status: DC
  Administered 2021-06-29 – 2021-06-30 (×5): 600 mg via ORAL
  Filled 2021-06-29 (×5): qty 1

## 2021-06-29 MED ORDER — OXYTOCIN BOLUS FROM INFUSION
333.0000 mL | Freq: Once | INTRAVENOUS | Status: AC
  Administered 2021-06-29: 333 mL via INTRAVENOUS

## 2021-06-29 MED ORDER — OXYCODONE-ACETAMINOPHEN 5-325 MG PO TABS: 2.0000 | ORAL_TABLET | ORAL | Status: DC | PRN

## 2021-06-29 MED ORDER — BENZOCAINE-MENTHOL 20-0.5 % EX AERO: 1.0000 "application " | INHALATION_SPRAY | CUTANEOUS | Status: DC | PRN

## 2021-06-29 MED ORDER — ACETAMINOPHEN 325 MG PO TABS: 650.0000 mg | ORAL_TABLET | ORAL | Status: DC | PRN

## 2021-06-29 MED ORDER — TRANEXAMIC ACID-NACL 1000-0.7 MG/100ML-% IV SOLN
1000.0000 mg | INTRAVENOUS | Status: AC
  Administered 2021-06-29: 1000 mg via INTRAVENOUS

## 2021-06-29 MED ORDER — LIDOCAINE HCL (PF) 1 % IJ SOLN: 30.0000 mL | INTRAMUSCULAR | Status: DC | PRN

## 2021-06-29 MED ORDER — COCONUT OIL OIL: 1.0000 "application " | TOPICAL_OIL | Status: DC | PRN

## 2021-06-29 MED ORDER — SOD CITRATE-CITRIC ACID 500-334 MG/5ML PO SOLN
30.0000 mL | ORAL | Status: DC | PRN
Start: 2021-06-29 — End: 2021-06-29

## 2021-06-29 MED ORDER — DIBUCAINE (PERIANAL) 1 % EX OINT: 1.0000 "application " | TOPICAL_OINTMENT | CUTANEOUS | Status: DC | PRN

## 2021-06-29 MED ORDER — ONDANSETRON HCL 4 MG PO TABS: 4.0000 mg | ORAL_TABLET | ORAL | Status: DC | PRN

## 2021-06-29 MED ORDER — ONDANSETRON HCL 4 MG/2ML IJ SOLN: 4.0000 mg | INTRAMUSCULAR | Status: DC | PRN

## 2021-06-29 MED ORDER — MISOPROSTOL 200 MCG PO TABS
ORAL_TABLET | ORAL | Status: AC
  Administered 2021-06-29: 800 ug via RECTAL
  Filled 2021-06-29: qty 4

## 2021-06-29 MED ORDER — MISOPROSTOL 200 MCG PO TABS: 800.0000 ug | ORAL_TABLET | Freq: Once | ORAL | Status: AC

## 2021-06-29 MED ORDER — LACTATED RINGERS IV SOLN: INTRAVENOUS | Status: DC

## 2021-06-29 MED ORDER — SIMETHICONE 80 MG PO CHEW: 80.0000 mg | CHEWABLE_TABLET | ORAL | Status: DC | PRN

## 2021-06-29 MED ORDER — OXYTOCIN-SODIUM CHLORIDE 30-0.9 UT/500ML-% IV SOLN
2.5000 [IU]/h | INTRAVENOUS | Status: DC
  Filled 2021-06-29: qty 500

## 2021-06-29 MED ORDER — ONDANSETRON HCL 4 MG/2ML IJ SOLN: 4.0000 mg | Freq: Four times a day (QID) | INTRAMUSCULAR | Status: DC | PRN

## 2021-06-29 MED ORDER — WITCH HAZEL-GLYCERIN EX PADS: 1.0000 "application " | MEDICATED_PAD | CUTANEOUS | Status: DC | PRN

## 2021-06-29 MED ORDER — TETANUS-DIPHTH-ACELL PERTUSSIS 5-2.5-18.5 LF-MCG/0.5 IM SUSY: 0.5000 mL | PREFILLED_SYRINGE | Freq: Once | INTRAMUSCULAR | Status: DC

## 2021-06-29 MED ORDER — DIPHENHYDRAMINE HCL 25 MG PO CAPS: 25.0000 mg | ORAL_CAPSULE | Freq: Four times a day (QID) | ORAL | Status: DC | PRN

## 2021-06-29 MED ORDER — FENTANYL CITRATE (PF) 100 MCG/2ML IJ SOLN: 100.0000 ug | INTRAMUSCULAR | Status: DC | PRN

## 2021-06-29 MED ORDER — PRENATAL MULTIVITAMIN CH
1.0000 | ORAL_TABLET | Freq: Every day | ORAL | Status: DC
  Administered 2021-06-29 – 2021-06-30 (×2): 1 via ORAL
  Filled 2021-06-29 (×2): qty 1

## 2021-06-29 MED ORDER — OXYCODONE-ACETAMINOPHEN 5-325 MG PO TABS: 1.0000 | ORAL_TABLET | ORAL | Status: DC | PRN

## 2021-06-29 MED ORDER — LACTATED RINGERS IV SOLN: 500.0000 mL | INTRAVENOUS | Status: DC | PRN

## 2021-06-29 NOTE — Discharge Summary (Signed)
? ?  Postpartum Discharge Summary ? ? ? ?   ?Patient Name: Melissa Navarro ?DOB: 2000/09/18 ?MRN: [id number] ? ?Date of admission: 06/29/2021 ?Delivery date:06/29/2021  ?Delivering provider: Fatima Blank A  ?Date of discharge: 06/30/2021 ? ?Admitting diagnosis: Labor and delivery, indication for care [O75.9] ?Intrauterine pregnancy: [redacted]w[redacted]d    ?Secondary diagnosis:  Principal Problem: ?  NSVD (normal spontaneous vaginal delivery) ? ?Additional problems: none    ?Discharge diagnosis: Term Pregnancy Delivered                                              ?Post partum procedures:Nexplanon ?Augmentation: AROM ?Complications: None ? ?Hospital course: Onset of Labor With Vaginal Delivery      ?21y.o. yo G1P0 at 352w2das admitted in Active Labor on 06/29/2021. Patient had an uncomplicated labor course as follows:  ?Membrane Rupture Time/Date: 6:03 AM ,06/29/2021   ?Delivery Method:Vaginal, Spontaneous  ?Episiotomy: None  ?Lacerations:  Labial  ?Patient had an uncomplicated postpartum course.  She is ambulating, tolerating a regular diet, passing flatus, and urinating well. Patient is discharged home in stable condition on 06/30/21. ? ?Newborn Data: ?Birth date:06/29/2021  ?Birth time:8:07 AM  ?Gender:Female  ?Living status:Living  ?Apgars:8 ,9  ?Weight:2780 g  ? ?Magnesium Sulfate received: No ?BMZ received: No ?Rhophylac:No ?MMR:No ?T-DaP: declines ?Flu: No ?Transfusion:No ? ?Physical exam  ?Vitals:  ? 06/29/21 1530 06/29/21 1930 06/29/21 2330 06/30/21 0700  ?BP: 112/70 109/64 109/70 106/67  ?Pulse: 85 71 69 67  ?Resp: _0 ?Temp: 98.2 ?F (36.8 ?C) 98.1 ?F (36.7 ?C) 98.2 ?F (36.8 ?C) 97.7 ?F (36.5 ?C)  ?TempSrc: Oral Oral Oral Oral  ?SpO2: 100% 100% 98% 100%  ? ?General: alert, cooperative, and no distress ?Lochia: appropriate ?Uterine Fundus: firm ?Incision: N/A ?DVT Evaluation: No evidence of DVT seen on physical exam. ?Negative Homan's sign. ?No cords or calf tenderness. ?Labs: ?Lab Results  ?Component Value Date   ? WBC 16.8 (H) 06/29/2021  ? HGB 12.3 06/29/2021  ? HCT 35.2 (L) 06/29/2021  ? MCV 87.1 06/29/2021  ? PLT 240 06/29/2021  ? ?CMP Latest Ref Rng & Units 02/22/2016  ?Glucose 65 - 99 mg/dL 88  ?BUN 6 - 20 mg/dL 12  ?Creatinine 0.50 - 1.00 mg/dL 0.82  ?Sodium 135 - 145 mmol/L 136  ?Potassium 3.5 - 5.1 mmol/L 4.5  ?Chloride 101 - 111 mmol/L 106  ?CO2 22 - 32 mmol/L 23  ?Calcium 8.9 - 10.3 mg/dL 9.0  ?Total Protein 6.5 - 8.1 g/dL 7.0  ?Total Bilirubin 0.3 - 1.2 mg/dL 0.5  ?Alkaline Phos 50 - 162 U/L 69  ?AST 15 - 41 U/L 14(L)  ?ALT 14 - 54 U/L 10(L)  ? ?Edinburgh Score: ?Edinburgh Postnatal Depression Scale Screening Tool 06/29/2021  ?I have been able to laugh and see the funny side of things. 0  ?I have looked forward with enjoyment to things. 0  ?I have blamed myself unnecessarily when things went wrong. 2  ?I have been anxious or worried for no good reason. 2  ?I have felt scared or panicky for no good reason. 2  ?Things have been getting on top of me. 0  ?I have been so unhappy that I have had difficulty sleeping. 1  ?I have felt sad or miserable. 2  ?I have been so unhappy that I have been crying. 2  ?  The thought of harming myself has occurred to me. 1  ?Edinburgh Postnatal Depression Scale Total 12  ? ? ? ?After visit meds:  ?Allergies as of 06/30/2021   ?No Known Allergies ?  ? ?  ?Medication List  ?  ? ?TAKE these medications   ? ?ibuprofen 600 MG tablet ?Commonly known as: ADVIL ?Take 1 tablet (600 mg total) by mouth every 6 (six) hours. ?  ?multivitamin-prenatal 27-0.8 MG Tabs tablet ?Take 1 tablet by mouth daily at 12 noon. ?  ? ?  ? ? ? ?Discharge home in stable condition ?Infant Feeding: Breast ?Infant Disposition:home with mother ?Discharge instruction: per After Visit Summary and Postpartum booklet. ?Activity: Advance as tolerated. Pelvic rest for 6 weeks.  ?Diet: routine diet ?Future Appointments: ?No future appointments. ? ?Follow up Visit: ? ?Message sent to [facility] on 06/29/21: ?Please schedule  this patient for a In person postpartum visit in 4 weeks with the following provider: Any provider. ?Additional Postpartum F/U:Postpartum Depression checkup  ?Low risk pregnancy complicated by:  none ?Delivery mode:  Vaginal, Spontaneous  ?Anticipated Birth Control:  inpt nexplanon ? ? ?06/30/2021 ?Christin Fudge, CNM ? ? ? ?

## 2021-06-29 NOTE — Telephone Encounter (Signed)
Left patient an urgent message to call the office to schedule as soon as possible. ?

## 2021-06-29 NOTE — MAU Note (Addendum)
Pt reports to MAU via EMS with c/o contractions that started this am.  Pt states that she has been leaking clear fluid.  +FM  Cervical exam presents as 7, 90, -2.  Pt observed to be very uncomfortable.  Pt transferred to L&D accompanied by Sharen Counter, CNM.   ?

## 2021-06-29 NOTE — Progress Notes (Signed)
Melissa Navarro is a 21 y.o. G1P0 at [redacted]w[redacted]d admitted for active labor ? ?Subjective: ?Pt breathing/moaning with contractions. Support person at bedside. She denies need for medication. ? ?Objective: ?BP 127/65   Pulse 87   Temp 98.5 ?F (36.9 ?C) (Oral)   Resp 20   LMP 10/17/2020  ?No intake/output data recorded. ?No intake/output data recorded. ? ?FHT:  FHR: 135 bpm, variability: moderate,  accelerations:  Present,  decelerations:  Absent ?UC:   regular, every 2-3 minutes ?SVE:   Dilation: 7 ?Effacement (%): 100 ?Station: Plus 2 ?Exam by:: Misty Stanley Leftwhich-Kirby ? ?Labs: ?Lab Results  ?Component Value Date  ? WBC 16.8 (H) 06/29/2021  ? HGB 12.3 06/29/2021  ? HCT 35.2 (L) 06/29/2021  ? MCV 87.1 06/29/2021  ? PLT 240 06/29/2021  ? ? ?Assessment / Plan: ?Spontaneous labor, progressing normally ? ?Labor: Progressing normally. Pt was pushing with RN but per my exam, has very thin cervix but 7 cm with BBOW, vertex position confirmed.  Discussed options including expectant management and AROM with pt. Pt prefers expectant management. Pt to rest sidelying and notify RN when she feels increased rectal pressure or feels a gush of fluid.   ?Preeclampsia:   n/a ?Fetal Wellbeing:  Category I ?Pain Control:  Labor support without medications ?I/D:   GBS neg ?Anticipated MOD:  NSVD ? ?Misty Stanley Leftwich-Kirby ?06/29/2021, 5:24 AM ? ? ?

## 2021-06-29 NOTE — Lactation Note (Signed)
This note was copied from a baby's chart. ?Lactation Consultation Note ? ?Patient Name: Melissa Navarro ?Today's Date: 06/29/2021 ?Reason for consult: Follow-up assessment ?Age:20 hours ? ?P1, Baby latched upon entering with intermittent swallows. ?Reminded mother to keep baby deep on breast. ?Feed on demand with cues.  Goal 8-12+ times per day after first 24 hrs.  Place baby STS if not cueing.  ?Mom made aware of O/P services, breastfeeding support groups, community resources, and our phone # for post-discharge questions.  ? ? ?Maternal Data ?Has patient been taught Hand Expression?: Yes ?Does the patient have breastfeeding experience prior to this delivery?: No ? ?Feeding ?Mother's Current Feeding Choice: Breast Milk ? ?LATCH Score ?Latch: Grasps breast easily, tongue down, lips flanged, rhythmical sucking. ? ?Audible Swallowing: A few with stimulation ? ?Type of Nipple: Everted at rest and after stimulation ? ?Comfort (Breast/Nipple): Soft / non-tender ? ?Hold (Positioning): No assistance needed to correctly position infant at breast. ? ?LATCH Score: 9 ? ? ?Lactation Tools Discussed/Used ?  ? ?Interventions ?Interventions: Breast feeding basics reviewed;Education;LC Services brochure ? ?Discharge ?  ? ?Consult Status ?Consult Status: Follow-up ?Date: 06/30/21 ?Follow-up type: In-patient ? ? ? ?Vivianne Master Boschen ?06/29/2021, 12:10 PM ? ? ? ?

## 2021-06-29 NOTE — H&P (Signed)
Melissa Navarro is a 21 y.o. female G1P0  at [redacted]w[redacted]d presenting for active labor at term. Her pregnancy is significant for chlamydia in pregnancy.  She started prenatal care at [facility] until 21 weeks, then had some care out of town, then returned to [place] at 36 weeks. ? ? ?Nursing Staff Provider  ?Office Location  [place] Dating  12 wk bedside US  ?Language  English Anatomy US  Wnl, EICF, f/u in 4 weeks  ?Flu Vaccine   Genetic/Carrier Screen  NIPS:NML Female    ?AFP:  Negative  ?Horizon:NEG  ?TDaP Vaccine    Hgb A1C or  ?GTT Early  ?Third trimester   ?COVID Vaccine Declines, counseled   LAB RESULTS   ?Rhogam  NA Blood Type O/RH(D) POSITIVE/-- (09/12 1216)   ?Baby Feeding Plan Breast Antibody NO ANTIBODIES DETECTED (09/12 1216)  ?Contraception IUD vs Depo Rubella 4.63 (09/12 1216)  ?Circumcision Boy- yes  RPR NON-REACTIVE (09/12 1216)   ?Pediatrician   HBsAg NON-REACTIVE (09/12 1216)   ?Support Person Jaheem HCVAb Neg  ?Prenatal Classes  HIV NON-REACTIVE (09/12 1216)     ?BTL Consent  GBS   ?(For PCN allergy, check sensitivities)   ?VBAC Consent  Pap NA- age.   ?     ?DME Rx [ ]  BP cuff ?[ ]  Weight Scale Waterbirth  [ ]  Class [ ]  Consent [ ]  CNM visit  ?PHQ9 & GAD7 [ x ] new OB ?[  ] 28 weeks  ?[  ] 36 weeks Induction  [ ]  Orders Entered [ ] Foley Y/N  ? ? ? ?OB History   ? ? Gravida  ?1  ? Para  ?   ? Term  ?   ? Preterm  ?   ? AB  ?   ? Living  ?   ?  ? ? SAB  ?   ? IAB  ?   ? Ectopic  ?   ? Multiple  ?   ? Live Births  ?   ?   ?  ?  ? ?Past Medical History:  ?Diagnosis Date  ? ADHD (attention deficit hyperactivity disorder)   ? Anxiety   ? Depression   ? ?No past surgical history on file. ?Family History: family history includes Stomach cancer in her maternal aunt and maternal grandmother. ?Social History:  reports that she has never smoked. She has never used smokeless tobacco. She reports that she does not currently use alcohol. She reports current drug use. Drug: Marijuana. ? ? ?  ?Maternal Diabetes:  No ?Genetic Screening: Normal ?Maternal Ultrasounds/Referrals: Normal ?Fetal Ultrasounds or other Referrals:  None ?Maternal Substance Abuse:  Yes:  Type: Marijuana ?Significant Maternal Medications:  None ?Significant Maternal Lab Results:  Group B Strep negative ?Other Comments:  None ? ?Review of Systems ?Maternal Medical History:  ?Reason for admission: Contractions.  ? ?Contractions: Onset was 3-5 hours ago.   ?Frequency: regular.   ?Perceived severity is moderate.   ?Fetal activity: Perceived fetal activity is normal.   ?Last perceived fetal movement was within the past hour.   ?Prenatal complications: no prenatal complications ?Prenatal Complications - Diabetes: none. ? ?Dilation: 7 ?Effacement (%): 90 ?Station: -2 ?Exam by:: J.Bellamy, RN ?Blood pressure 133/85, pulse 81, temperature 98.5 ?F (36.9 ?C), temperature source Oral, resp. rate 20, last menstrual period 10/17/2020. ?Maternal Exam:  ?Uterine Assessment: Contraction strength is moderate.  Contraction frequency is regular.  ?Abdomen: Fetal presentation: vertex ?Pelvis: adequate for delivery.   ?Cervix: Cervix evaluated by  digital exam.   ? ? ?Fetal Exam ?Fetal Monitor Review: Mode: ultrasound.   ?Baseline rate: 135.  ?Variability: moderate (6-25 bpm).   ?Pattern: accelerations present and no decelerations.   ? ?Physical Exam ?Vitals and nursing note reviewed.  ?Constitutional:   ?   Appearance: She is well-developed.  ?Cardiovascular:  ?   Rate and Rhythm: Normal rate and regular rhythm.  ?Pulmonary:  ?   Effort: Pulmonary effort is normal.  ?   Breath sounds: Normal breath sounds.  ?Abdominal:  ?   Palpations: Abdomen is soft.  ?Musculoskeletal:     ?   General: Normal range of motion.  ?   Cervical back: Normal range of motion.  ?Skin: ?   General: Skin is warm and dry.  ?Neurological:  ?   Mental Status: She is alert and oriented to person, place, and time.  ?Psychiatric:     ?   Behavior: Behavior normal.     ?   Thought Content: Thought content  normal.     ?   Judgment: Judgment normal.  ?  ?Prenatal labs: ?ABO, Rh: --/--/PENDING (03/16 4196) ?Antibody: PENDING (03/16 2229) ?Rubella: 4.63 (09/12 1216) ?RPR: NON-REACTIVE (09/12 1216)  ?HBsAg: NON-REACTIVE (09/12 1216)  ?HIV: NON-REACTIVE (09/12 1216)  ?GBS:   Negative 06/06/21 ? ?Assessment/Plan: ?G1P0 at [redacted]w[redacted]d  admitted for active labor at term ?GBS negative ?Plans IUD vs Depo for contraception ? ? ?Misty Stanley Leftwich-Kirby ?06/29/2021, 3:42 AM ? ? ? ? ?

## 2021-06-29 NOTE — Telephone Encounter (Signed)
-----   Message from Hurshel Party, CNM sent at 06/29/2021  8:34 AM EDT ----- ?Regarding: postpartum schedule ?Please schedule this patient for a In person postpartum visit in 4 -6 weeks with the following provider: Any provider.  With Misty Stanley if possible in late April, or any provider if not. ?Additional Postpartum F/U:Postpartum Depression checkup  ?Low risk pregnancy complicated by:  none ?Delivery mode:? Vaginal, Spontaneous  ?Anticipated Birth Control:?  IUD vs Depo ? ?

## 2021-06-29 NOTE — Lactation Note (Signed)
This note was copied from a baby's chart. ?Lactation Consultation Note ? ?Patient Name: Boy Shifa Brisbon ?Today's Date: 06/29/2021 ?Reason for consult: L&D Initial assessment ?Age:20 hours ? ?P1, Baby alert and cueing upon entering. ?Guided baby to breast and he latched with ease, flanged lips, intermittent swallows. ?Provided education regarding how infant's breath while breastfeeding.  Lactation to follow up on MBU.  ? ?Maternal Data ?Does the patient have breastfeeding experience prior to this delivery?: No ? ?LATCH Score ?Latch: Grasps breast easily, tongue down, lips flanged, rhythmical sucking. ? ?Audible Swallowing: A few with stimulation ? ?Type of Nipple: Everted at rest and after stimulation ? ?Comfort (Breast/Nipple): Soft / non-tender ? ?Hold (Positioning): Assistance needed to correctly position infant at breast and maintain latch. ? ?LATCH Score: 8 ? ?Interventions ?Interventions: Assisted with latch;Skin to skin;Education ? ?Consult Status ?Consult Status: Follow-up from L&D ? ? ? ?Dahlia Byes Boschen ?06/29/2021, 8:40 AM ? ? ? ?

## 2021-06-30 DIAGNOSIS — Z30017 Encounter for initial prescription of implantable subdermal contraceptive: Secondary | ICD-10-CM

## 2021-06-30 MED ORDER — LIDOCAINE HCL 1 % IJ SOLN
0.0000 mL | Freq: Once | INTRAMUSCULAR | Status: AC | PRN
  Administered 2021-06-30: 3 mL via INTRADERMAL
  Filled 2021-06-30: qty 20

## 2021-06-30 MED ORDER — ETONOGESTREL 68 MG ~~LOC~~ IMPL
68.0000 mg | DRUG_IMPLANT | Freq: Once | SUBCUTANEOUS | Status: AC
  Administered 2021-06-30: 68 mg via SUBCUTANEOUS
  Filled 2021-06-30: qty 1

## 2021-06-30 MED ORDER — IBUPROFEN 600 MG PO TABS: 600.0000 mg | ORAL_TABLET | Freq: Four times a day (QID) | ORAL | 0 refills | Status: DC
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
## 2021-06-30 NOTE — Plan of Care (Signed)
?Problem: Education: ?Goal: Knowledge of General Education information will improve ?Description: Including pain rating scale, medication(s)/side effects and non-pharmacologic comfort measures ?06/30/2021 0446 by Mertie Moores, RN ?Outcome: Progressing ?06/30/2021 0445 by Mertie Moores, RN ?Outcome: Progressing ?  ?Problem: Health Behavior/Discharge Planning: ?Goal: Ability to manage health-related needs will improve ?06/30/2021 0446 by Mertie Moores, RN ?Outcome: Progressing ?06/30/2021 0445 by Mertie Moores, RN ?Outcome: Progressing ?  ?Problem: Clinical Measurements: ?Goal: Ability to maintain clinical measurements within normal limits will improve ?06/30/2021 0446 by Mertie Moores, RN ?Outcome: Progressing ?06/30/2021 0445 by Mertie Moores, RN ?Outcome: Progressing ?Goal: Will remain free from infection ?06/30/2021 0446 by Mertie Moores, RN ?Outcome: Progressing ?06/30/2021 0445 by Mertie Moores, RN ?Outcome: Progressing ?Goal: Diagnostic test results will improve ?06/30/2021 0446 by Mertie Moores, RN ?Outcome: Progressing ?06/30/2021 0445 by Mertie Moores, RN ?Outcome: Progressing ?Goal: Respiratory complications will improve ?06/30/2021 0446 by Mertie Moores, RN ?Outcome: Progressing ?06/30/2021 0445 by Mertie Moores, RN ?Outcome: Progressing ?Goal: Cardiovascular complication will be avoided ?06/30/2021 0446 by Mertie Moores, RN ?Outcome: Progressing ?06/30/2021 0445 by Mertie Moores, RN ?Outcome: Progressing ?  ?Problem: Activity: ?Goal: Risk for activity intolerance will decrease ?06/30/2021 0446 by Mertie Moores, RN ?Outcome: Progressing ?06/30/2021 0445 by Mertie Moores, RN ?Outcome: Progressing ?  ?Problem: Nutrition: ?Goal: Adequate nutrition will be maintained ?06/30/2021 0446 by Mertie Moores, RN ?Outcome: Progressing ?06/30/2021 0445 by Mertie Moores, RN ?Outcome: Progressing ?  ?Problem:  Coping: ?Goal: Level of anxiety will decrease ?06/30/2021 0446 by Mertie Moores, RN ?Outcome: Progressing ?06/30/2021 0445 by Mertie Moores, RN ?Outcome: Progressing ?  ?Problem: Elimination: ?Goal: Will not experience complications related to bowel motility ?06/30/2021 0446 by Mertie Moores, RN ?Outcome: Progressing ?06/30/2021 0445 by Mertie Moores, RN ?Outcome: Progressing ?Goal: Will not experience complications related to urinary retention ?06/30/2021 0446 by Mertie Moores, RN ?Outcome: Progressing ?06/30/2021 0445 by Mertie Moores, RN ?Outcome: Progressing ?  ?Problem: Pain Managment: ?Goal: General experience of comfort will improve ?06/30/2021 0446 by Mertie Moores, RN ?Outcome: Progressing ?06/30/2021 0445 by Mertie Moores, RN ?Outcome: Progressing ?  ?Problem: Safety: ?Goal: Ability to remain free from injury will improve ?06/30/2021 0446 by Mertie Moores, RN ?Outcome: Progressing ?06/30/2021 0445 by Mertie Moores, RN ?Outcome: Progressing ?  ?Problem: Skin Integrity: ?Goal: Risk for impaired skin integrity will decrease ?06/30/2021 0446 by Mertie Moores, RN ?Outcome: Progressing ?06/30/2021 0445 by Mertie Moores, RN ?Outcome: Progressing ?  ?Problem: Education: ?Goal: Knowledge of condition will improve ?06/30/2021 0446 by Mertie Moores, RN ?Outcome: Progressing ?06/30/2021 0445 by Mertie Moores, RN ?Outcome: Progressing ?Goal: Individualized Educational Video(s) ?06/30/2021 0446 by Mertie Moores, RN ?Outcome: Progressing ?06/30/2021 0445 by Mertie Moores, RN ?Outcome: Progressing ?Goal: Individualized Newborn Educational Video(s) ?06/30/2021 0446 by Mertie Moores, RN ?Outcome: Progressing ?06/30/2021 0445 by Mertie Moores, RN ?Outcome: Progressing ?  ?Problem: Activity: ?Goal: Will verbalize the importance of balancing activity with adequate rest periods ?06/30/2021 0446 by Mertie Moores,  RN ?Outcome: Progressing ?06/30/2021 0445 by Mertie Moores, RN ?Outcome: Progressing ?Goal: Ability to tolerate increased activity will improve ?06/30/2021 0446 by Mertie Moores, RN ?Outcome: Progressing ?06/30/2021 0445 by Mertie Moores, RN ?Outcome: Progressing ?  ?Problem: Coping: ?Goal: Ability to identify and utilize available resources and services will improve ?06/30/2021 0446 by Mertie Moores, RN ?Outcome: Progressing ?06/30/2021 0445 by Margarita Sermons  B, RN ?Outcome: Progressing ?  ?Problem: Life Cycle: ?Goal: Chance of risk for complications during the postpartum period will decrease ?06/30/2021 0446 by Mertie Moores, RN ?Outcome: Progressing ?06/30/2021 0445 by Mertie Moores, RN ?Outcome: Progressing ?  ?Problem: Role Relationship: ?Goal: Ability to demonstrate positive interaction with newborn will improve ?06/30/2021 0446 by Mertie Moores, RN ?Outcome: Progressing ?06/30/2021 0445 by Mertie Moores, RN ?Outcome: Progressing ?  ?Problem: Skin Integrity: ?Goal: Demonstration of wound healing without infection will improve ?06/30/2021 0446 by Mertie Moores, RN ?Outcome: Progressing ?06/30/2021 0445 by Mertie Moores, RN ?Outcome: Progressing ?  ?Pt stable throughout shift. Pain well-controlled by scheduled meds. Bleeding continues to decrease, uterus low and firm. VS stable. Pt latching and breastfeeding baby independently. Continue to monitor and support.  ?

## 2021-06-30 NOTE — Lactation Note (Signed)
This note was copied from a baby's chart. ?Lactation Consultation Note ? ?Patient Name: Melissa Navarro ?Today's Date: 06/30/2021 ?  ?Age:20 hours ? ?Mom says she has no questions or concerns about breastfeeding & that she has the appropriate supplies.  ? ?[name] ?06/30/2021, 2:36 PM ? ? ? ?

## 2021-06-30 NOTE — Procedures (Signed)
Post-Placental Nexplanon Insertion Procedure Note ? ?Patient was identified. Informed consent was signed, signed copy in chart. A time-out was performed.   ? ?The insertion site was identified 8-10 cm (3-4 inches) from the medial epicondyle of the humerus and 3-5 cm (1.25-2 inches) posterior to (below) the sulcus (groove) between the biceps and triceps muscles of the patient's right arm and marked. The site was prepped and draped in the usual sterile fashion. Pt was prepped with alcohol swab and then injected with 3 cc of 1% lidocaine. ?The site was prepped with betadine. Nexplanon removed form packaging,  Device confirmed in needle, then inserted full length of needle and withdrawn per handbook instructions. Provider and patient verified presence of the implant in the woman?s arm by palpation. Pt insertion site was covered with steristrips/adhesive bandage and pressure bandage. There was minimal blood loss. Patient tolerated procedure well. ? ?Patient was given post procedure instructions and Nexplanon user card with expiration date. Condoms were recommended for STI prevention. Patient was asked to keep the pressure dressing on for 24 hours to minimize bruising and keep the adhesive bandage on for 3-5 days. The patient verbalized understanding of the plan of care and agrees.  ? ?Lot # See MAR ?Expiration Date see Mar  ? ?

## 2021-06-30 NOTE — Progress Notes (Signed)
Circumcision Consent  Discussed with mom at bedside about circumcision.   Circumcision is a surgery that removes the skin that covers the tip of the penis, called the "foreskin." Circumcision is usually done when a boy is between 1 and 10 days old, sometimes up to 3-4 weeks old.  The most common reasons boys are circumcised include for cultural/religious beliefs or for parental preference (potentially easier to clean, so baby looks like daddy, etc).  There may be some medical benefits for circumcision:   Circumcised boys seem to have slightly lower rates of: ? Urinary tract infections (per the American Academy of Pediatrics an uncircumcised boy has a 1/100 chance of developing a UTI in the first year of life, a circumcised boy at a 04/998 chance of developing a UTI in the first year of life- a 10% reduction) ? Penis cancer (typically rare- an uncircumcised female has a 1 in 100,000 chance of developing cancer of the penis) ? Sexually transmitted infection (in endemic areas, including HIV, HPV and Herpes- circumcision does NOT protect against gonorrhea, chlamydia, trachomatis, or syphilis) ? Phimosis: a condition where that makes retraction of the foreskin over the glans impossible (0.4 per 1000 boys per year or 0.6% of boys are affected by their 15th birthday)  Boys and men who are not circumcised can reduce these extra risks by: ? Cleaning their penis well ? Using condoms during sex  What are the risks of circumcision?  As with any surgical procedure, there are risks and complications. In circumcision, complications are rare and usually minor, the most common being: ? Bleeding- risk is reduced by holding each clamp for 30 seconds prior to a cut being made, and by holding pressure after the procedure is done ? Infection- the penis is cleaned prior to the procedure, and the procedure is done under sterile technique ? Damage to the urethra or amputation of the penis  How is circumcision done  in baby boys?  The baby will be placed on a special table and the legs restrained for their safety. Numbing medication is injected into the penis, and the skin is cleansed with betadine to decrease the risk of infection.   What to expect:  The penis will look red and raw for 5-7 days as it heals. We expect scabbing around where the cut was made, as well as clear-pink fluid and some swelling of the penis right after the procedure. If your baby's circumcision starts to bleed or develops pus, please contact your pediatrician immediately.  All questions were answered and mother consented.  Meaghan Whistler [facility]stetric[facility] Fellow  

## 2021-07-03 ENCOUNTER — Telehealth (HOSPITAL_COMMUNITY): Payer: Self-pay | Admitting: *Deleted

## 2021-07-03 ENCOUNTER — Telehealth: Payer: Self-pay

## 2021-07-03 DIAGNOSIS — Z1331 Encounter for screening for depression: Secondary | ICD-10-CM

## 2021-07-03 NOTE — Telephone Encounter (Signed)
SW consult while inpatient revealed EPDS of 12 with SI earlier in pregnancy. Referral made to ambulatory IBH and Dr. Crissie Reese notified via chart. ? ?Duffy Rhody, RN 07-03-2021 at 3:09pm ?

## 2021-07-03 NOTE — Telephone Encounter (Signed)
Transition Care Management Unsuccessful Follow-up Telephone Call ? ?Date of discharge and from where:  06/30/2021 from [facility] ? ?Attempts:  1st Attempt ? ?Reason for unsuccessful TCM follow-up call:  Left voice message ? ? ? ?

## 2021-07-04 ENCOUNTER — Encounter: Payer: Medicaid Other | Admitting: Advanced Practice Midwife

## 2021-07-05 NOTE — Telephone Encounter (Signed)
Transition Care Management Unsuccessful Follow-up Telephone Call ? ?Date of discharge and from where:  06/30/2021 from [facility] ? ?Attempts:  2nd Attempt ? ?Reason for unsuccessful TCM follow-up call:  Left voice message ? ? ? ?

## 2021-07-06 NOTE — Telephone Encounter (Signed)
Transition Care Management Unsuccessful Follow-up Telephone Call ? ?Date of discharge and from where:  06/30/2021 from [facility] ? ?Attempts:  3rd Attempt ? ?Reason for unsuccessful TCM follow-up call:  Unable to reach patient ? ? ? ?

## 2021-07-13 NOTE — BH Specialist Note (Signed)
Pt did not arrive to video visit and did not answer the phone; Left HIPPA-compliant message to call back Chiana Wamser from [facility] at  [phone] (Danthony Kendrix's office).  ?; left MyChart message for patient.  ? ?

## 2021-07-15 DIAGNOSIS — Z419 Encounter for procedure for purposes other than remedying health state, unspecified: Secondary | ICD-10-CM | POA: Diagnosis not present

## 2021-07-17 ENCOUNTER — Emergency Department (HOSPITAL_COMMUNITY)
Admission: EM | Admit: 2021-07-17 | Discharge: 2021-07-17 | Disposition: A | Discharge status: Home / Self Care | Payer: Medicaid Other | Attending: Emergency Medicine | Admitting: Emergency Medicine

## 2021-07-17 ENCOUNTER — Ambulatory Visit: Payer: Self-pay

## 2021-07-17 ENCOUNTER — Other Ambulatory Visit: Payer: Self-pay

## 2021-07-17 DIAGNOSIS — E876 Hypokalemia: Secondary | ICD-10-CM | POA: Diagnosis not present

## 2021-07-17 DIAGNOSIS — Z20822 Contact with and (suspected) exposure to covid-19: Secondary | ICD-10-CM | POA: Diagnosis not present

## 2021-07-17 DIAGNOSIS — R509 Fever, unspecified: Secondary | ICD-10-CM | POA: Diagnosis not present

## 2021-07-17 DIAGNOSIS — N179 Acute kidney failure, unspecified: Secondary | ICD-10-CM | POA: Diagnosis not present

## 2021-07-17 LAB — CBC WITH DIFFERENTIAL/PLATELET
Abs Immature Granulocytes: 0.02 10*3/uL (ref 0.00–0.07)
Basophils Absolute: 0 10*3/uL (ref 0.0–0.1)
Basophils Relative: 0 %
Eosinophils Absolute: 0 10*3/uL (ref 0.0–0.5)
Eosinophils Relative: 0 %
HCT: 37.5 % (ref 36.0–46.0)
Hemoglobin: 12.5 g/dL (ref 12.0–15.0)
Immature Granulocytes: 0 %
Lymphocytes Relative: 15 %
Lymphs Abs: 0.9 10*3/uL (ref 0.7–4.0)
MCH: 29.8 pg (ref 26.0–34.0)
MCHC: 33.3 g/dL (ref 30.0–36.0)
MCV: 89.3 fL (ref 80.0–100.0)
Monocytes Absolute: 0.4 10*3/uL (ref 0.1–1.0)
Monocytes Relative: 6 %
Neutro Abs: 4.7 10*3/uL (ref 1.7–7.7)
Neutrophils Relative %: 79 %
Platelets: 254 10*3/uL (ref 150–400)
RBC: 4.2 MIL/uL (ref 3.87–5.11)
RDW: 12.6 % (ref 11.5–15.5)
WBC: 6 10*3/uL (ref 4.0–10.5)
nRBC: 0 % (ref 0.0–0.2)

## 2021-07-17 LAB — COMPREHENSIVE METABOLIC PANEL
ALT: 16 U/L (ref 0–44)
AST: 15 U/L (ref 15–41)
Albumin: 3.1 g/dL — ABNORMAL LOW (ref 3.5–5.0)
Alkaline Phosphatase: 77 U/L (ref 38–126)
Anion gap: 11 (ref 5–15)
BUN: 6 mg/dL (ref 6–20)
CO2: 21 mmol/L — ABNORMAL LOW (ref 22–32)
Calcium: 8.7 mg/dL — ABNORMAL LOW (ref 8.9–10.3)
Chloride: 105 mmol/L (ref 98–111)
Creatinine, Ser: 1.09 mg/dL — ABNORMAL HIGH (ref 0.44–1.00)
GFR, Estimated: >60 mL/min (ref >60)
Glucose, Bld: 117 mg/dL — ABNORMAL HIGH (ref 70–99)
Potassium: 3 mmol/L — ABNORMAL LOW (ref 3.5–5.1)
Sodium: 137 mmol/L (ref 135–145)
Total Bilirubin: 0.8 mg/dL (ref 0.3–1.2)
Total Protein: 6.3 g/dL — ABNORMAL LOW (ref 6.5–8.1)

## 2021-07-17 LAB — URINALYSIS, ROUTINE W REFLEX MICROSCOPIC
Bilirubin Urine: NEGATIVE
Glucose, UA: NEGATIVE mg/dL
Ketones, ur: 20 mg/dL — AB
Nitrite: NEGATIVE
Protein, ur: NEGATIVE mg/dL
Specific Gravity, Urine: 1.006 (ref 1.005–1.030)
pH: 6 (ref 5.0–8.0)

## 2021-07-17 LAB — RESP PANEL BY RT-PCR (FLU A&B, COVID) ARPGX2
Influenza A by PCR: NEGATIVE
Influenza B by PCR: NEGATIVE
SARS Coronavirus 2 by RT PCR: NEGATIVE

## 2021-07-17 MED ORDER — ACETAMINOPHEN 500 MG PO TABS
1000.0000 mg | ORAL_TABLET | Freq: Once | ORAL | Status: AC
  Administered 2021-07-17: 1000 mg via ORAL
  Filled 2021-07-17: qty 2

## 2021-07-17 MED ORDER — CEPHALEXIN 500 MG PO CAPS: 500.0000 mg | ORAL_CAPSULE | Freq: Three times a day (TID) | ORAL | 0 refills | Status: DC

## 2021-07-17 MED ORDER — POTASSIUM CHLORIDE 10 MEQ/100ML IV SOLN
10.0000 meq | Freq: Once | INTRAVENOUS | Status: AC
  Administered 2021-07-17: 10 meq via INTRAVENOUS
  Filled 2021-07-17: qty 100

## 2021-07-17 MED ORDER — SODIUM CHLORIDE 0.9 % IV BOLUS
1000.0000 mL | Freq: Once | INTRAVENOUS | Status: AC
  Administered 2021-07-17: 1000 mL via INTRAVENOUS

## 2021-07-17 MED ORDER — POTASSIUM CHLORIDE CRYS ER 20 MEQ PO TBCR
40.0000 meq | EXTENDED_RELEASE_TABLET | Freq: Once | ORAL | Status: AC
Start: 2021-07-17 — End: 2021-07-17
  Administered 2021-07-17: 40 meq via ORAL
  Filled 2021-07-17: qty 2

## 2021-07-17 NOTE — ED Notes (Signed)
Ambulatory to bathroom to provide urine specimen.

## 2021-07-17 NOTE — ED Provider Triage Note (Addendum)
Emergency Medicine Provider Triage Evaluation Note ? ?Melissa Navarro , a 21 y.o. female  was evaluated in triage.  Pt complains of fever, headache, body aches started today. Vaginal birth 2 weeks ago. No vaginal discharge. Denies any breast swelling. Son is upstairs in NICU, she had the nurses check her temperature and it was 103.0. She took Tylenol 1000mg  at 1400. ? ?Review of Systems  ?Positive: Fever, headache, bodyaches, lightheadedness ?Negative: Rhinorrhea, nasal congestion, cough, chest pain, SOB, sore throat.  ? ?Physical Exam  ?BP (!) 96/59   Pulse 97   Temp 99.2 ?F (37.3 ?C) (Oral)   Resp 16   LMP 10/17/2020   SpO2 97%  ?Gen:   Awake, no distress   ?Resp:  Normal effort  ?MSK:   Moves extremities without difficulty  ?Other:  No nuchal rigidity. Full ROM of neck.  ? ?Medical Decision Making  ?Medically screening exam initiated at 4:01 PM.  Appropriate orders placed.  MARTA BOUIE was informed that the remainder of the evaluation will be completed by another provider, this initial triage assessment does not replace that evaluation, and the importance of remaining in the ED until their evaluation is complete. ? ?COVID and flu ordered. Will order basic labs and EKG because of the lightheadedness.  ?  ?Melissa Minks, PA-C ?07/17/21 1605 ? ?  ?09/16/21, PA-C ?07/17/21 1607 ? ?

## 2021-07-17 NOTE — ED Notes (Signed)
Pt had to leave to be with baby, no verbal instructions were given. ?

## 2021-07-17 NOTE — ED Notes (Signed)
Patient still unable to provide urine specimen at this time.

## 2021-07-17 NOTE — ED Provider Notes (Addendum)
?[facility] EMERGENCY DEPARTMENT ?Provider Note ? ? ?CSN: [id number] ?Arrival date & time: 07/17/21  1524 ? ?  ? ?History ? ?Chief Complaint  ?Patient presents with  ? Fever  ? Dizziness  ? ? ?Melissa Navarro is a 21 y.o. female. ? ?Patient presents with mild frontal headache, fever, body aches that started earlier today.  Patient had vaginal delivery she said that went well 2 weeks ago however her infant is in the NICU currently.  No vaginal discharge no vaginal bleeding no breast swelling tenderness or concern for infection in the breast.  Patient had 103 temp checked by the NICU nurse.  Patient took Tylenol 1000 mg at 2:00.  Patient denies any significant cough or runny nose.  Sick contacts just being in the hospital with her baby.  No urinary symptoms.  No neck stiffness.  Patient has no medical problems.  Patient has not been sleeping well due to child in the NICU. ? ? ?  ? ?Home Medications ?Prior to Admission medications   ?Medication Sig Start Date End Date Taking? Authorizing Provider  ?ibuprofen (ADVIL) 600 MG tablet Take 1 tablet (600 mg total) by mouth every 6 (six) hours. 06/30/21   Jacklyn Shell, CNM  ?Prenatal Vit-Fe Fumarate-FA (MULTIVITAMIN-PRENATAL) 27-0.8 MG TABS tablet Take 1 tablet by mouth daily at 12 noon.    [provider]  ?   ? ?Allergies    ?Patient has no known allergies.   ? ?Review of Systems   ?Review of Systems  ?Constitutional:  Positive for fatigue and fever. Negative for chills.  ?HENT:  Negative for congestion.   ?Eyes:  Negative for visual disturbance.  ?Respiratory:  Negative for shortness of breath.   ?Cardiovascular:  Negative for chest pain.  ?Gastrointestinal:  Negative for abdominal pain and vomiting.  ?Genitourinary:  Negative for dysuria and flank pain.  ?Musculoskeletal:  Negative for back pain, neck pain and neck stiffness.  ?Skin:  Negative for rash.  ?Neurological:  Positive for light-headedness and headaches.  ? ?Physical  Exam ?Updated Vital Signs ?BP 122/80 (BP Location: Left Arm)   Pulse 63   Temp 99.2 ?F (37.3 ?C) (Oral)   Resp 16   LMP 10/17/2020   SpO2 100%  ?Physical Exam ?Vitals and nursing note reviewed.  ?Constitutional:   ?   General: She is not in acute distress. ?   Appearance: She is well-developed.  ?HENT:  ?   Head: Normocephalic and atraumatic.  ?   Comments: Dry mucous membranes ?   Mouth/Throat:  ?   Mouth: Mucous membranes are moist.  ?   Pharynx: No oropharyngeal exudate or posterior oropharyngeal erythema.  ?Eyes:  ?   General:     ?   Right eye: No discharge.     ?   Left eye: No discharge.  ?   Conjunctiva/sclera: Conjunctivae normal.  ?Neck:  ?   Trachea: No tracheal deviation.  ?Cardiovascular:  ?   Rate and Rhythm: Normal rate and regular rhythm.  ?   Heart sounds: No murmur heard. ?Pulmonary:  ?   Effort: Pulmonary effort is normal.  ?   Breath sounds: Normal breath sounds.  ?Abdominal:  ?   General: There is no distension.  ?   Palpations: Abdomen is soft.  ?   Tenderness: There is no abdominal tenderness. There is no guarding.  ?Musculoskeletal:     ?   General: No swelling.  ?   Cervical back: Normal range of motion and  neck supple. No rigidity or tenderness.  ?Skin: ?   General: Skin is warm.  ?   Capillary Refill: Capillary refill takes less than 2 seconds.  ?   Findings: No rash.  ?Neurological:  ?   General: No focal deficit present.  ?   Mental Status: She is alert.  ?   Cranial Nerves: No cranial nerve deficit.  ?   Comments: Patient sleeping in the bed, awakes to verbal discussion, tired appearing, no meningismus.  ?Psychiatric:     ?   Mood and Affect: Mood normal.  ? ? ?ED Results / Procedures / Treatments   ?Labs ?(all labs ordered are listed, but only abnormal results are displayed) ?Labs Reviewed  ?COMPREHENSIVE METABOLIC PANEL - Abnormal; Notable for the following components:  ?    Result Value  ? Potassium 3.0 (*)   ? CO2 21 (*)   ? Glucose, Bld 117 (*)   ? Creatinine, Ser 1.09 (*)   ?  Calcium 8.7 (*)   ? Total Protein 6.3 (*)   ? Albumin 3.1 (*)   ? All other components within normal limits  ?URINALYSIS, ROUTINE W REFLEX MICROSCOPIC - Abnormal; Notable for the following components:  ? Hgb urine dipstick LARGE (*)   ? Ketones, ur 20 (*)   ? Leukocytes,Ua SMALL (*)   ? Bacteria, UA RARE (*)   ? All other components within normal limits  ?RESP PANEL BY RT-PCR (FLU A&B, COVID) ARPGX2  ?CBC WITH DIFFERENTIAL/PLATELET  ? ? ?EKG ?None ? ?Radiology ?No results found. ? ?Procedures ?Procedures  ? ? ?Medications Ordered in ED ?Medications  ?sodium chloride 0.9 % bolus 1,000 mL (0 mLs Intravenous Stopped 07/17/21 1837)  ?potassium chloride 10 mEq in 100 mL IVPB (0 mEq Intravenous Stopped 07/17/21 1837)  ?potassium chloride SA (KLOR-CON M) CR tablet 40 mEq (40 mEq Oral Given 07/17/21 1736)  ?acetaminophen (TYLENOL) tablet 1,000 mg (1,000 mg Oral Given 07/17/21 1741)  ? ? ?ED Course/ Medical Decision Making/ A&P ?  ?                        ?Medical Decision Making ?Amount and/or Complexity of Data Reviewed ?Labs: ordered. ? ?Risk ?OTC drugs. ?Prescription drug management. ? ? ?Patient presents for upstairs as her child is being treated in the NICU and she developed fever, headache, body aches.  Discussed differential including viral/COVID/similar, urine infection, other.  No signs of serious bacterial infection at this time, vital signs normal range on assessment.  Patient has signs of mild dehydration and not able to produce urine, IV fluids ordered.  IV and oral potassium to help with potassium levels specially since she feels tired. ? ?COVID, flu test results reviewed negative. ? ?UA pending check for urine infection. ?Patient improved after IV fluids, vital signs normal on reassessment.  Patient requesting be discharged as she needs to meet the NICU team for huddle/plan discussion at shift change.  Instructed patient to call later this evening to follow-up urine test results in case I need to call her in  antibiotics.   ?UA reviewed mild hemoglobin and mild white blood cells, culture added on.  Follow-up discussed. ? ? ? ? ? ? ? ?Final Clinical Impression(s) / ED Diagnoses ?Final diagnoses:  ?Acute renal failure, unspecified acute renal failure type (HCC)  ?Fever in adult  ?Hypokalemia  ? ? ?Rx / DC Orders ?ED Discharge Orders   ? ? None  ? ?  ? ? ?  ?  Blane OharaZavitz, Takota Cahalan, MD ?07/17/21 1900 ? ?  ?Blane OharaZavitz, Marisel Tostenson, MD ?07/17/21 1900 ? ?

## 2021-07-17 NOTE — Discharge Instructions (Addendum)
Take antibiotics and follow up urine culture result in 2 to 3 days with your doctor. ?Wear a mask around your baby. ?Stay well-hydrated and follow-up with local physician for recheck later this week.  Use Tylenol every 4 hours and Motrin every 6 hours as needed for fever or body aches. ? ?

## 2021-07-17 NOTE — ED Notes (Signed)
Patient had water and didn't want anymore. Patient sipping on juice.  ?

## 2021-07-17 NOTE — ED Notes (Signed)
Pt called, voicemail left notifying the pt about discharge papers with rx being left at the front desk  ?

## 2021-07-17 NOTE — Lactation Note (Addendum)
This note was copied from a baby's chart. ?Lactation Consultation Note ? ?Patient Name: Melissa Navarro ?Today's Date: 07/17/2021 ?Reason for consult: Follow-up assessment;Mother's request;Difficult latch;Primapara;1st time breastfeeding;Term;Breastfeeding assistance;MD order ?Age:20 wk.o. ? ? ?Infant birthweight 2780 g ?Infant current weight 3125 g ? ?On arrival, Mom pumping with size 24 flange with 90 ml in the room. LC reviewed with Mom milk storage and milk discarded as it was over 4 hrs. RN to provide labels for Mom to keep track of milk with date and time.  ? ?EBM good 4 hrs RT, 4 days in fridge and 6 months in the freezer.  ?LC left to attend a meeting and returned to find she had used hand pump to get 150 ml more of milk.  ? ?Mom also felt warm temperature taken by RN getting 103 f. Grandmother arrived and provided Ibuprofen for her daughter. Grandmother fed from bottle 55 ml of formula.  ?Mom breasts are soft and milk leaking. LC encouraged Mom to use pump to empty milk for now she only has the manual and once parts arrive will increase to 27 flanges. RN to provide coconut oil for nipple care.  ? ?Mom nipples are intact and no signs of irritation. Skin is red but could be due to her fever. Mom given Nystatin cream stating she had some itching but at the time of visit, she states that has resolved.  ?LC shared findings with provider Katie Staulbaum. ? ?Plan 1 To feed based on cues 8-12x 24hr period. Mom to offer breast with compression in midst of the feeding.  ?2. Mom to supplement with EBM first followed by formula. If infant not going to breast offer more volume 60 ml or more as tolerated. ( Volume supplementation guide provided) ?3. Mom to use manual pump q 3hrs to release milk 10 to 15 mins.  ? ?All questions answered at the end of the visit.  ? ? ?Plan 1. To feed ?Maternal Data ?  ? ?Feeding ?Mother's Current Feeding Choice: Breast Milk and Formula ?Nipple Type: Slow - flow ? ?LATCH Score ?  ? ?  ? ?   ? ?  ? ?  ? ?  ? ? ?Lactation Tools Discussed/Used ?  ? ?Interventions ?Interventions: Assisted with latch;Breast massage;Hand express;Breast compression;Expressed milk;Coconut oil;Hand pump;DEBP;Education;Pace feeding;Theme park manager brochure ? ?Discharge ?Pump: Personal;Manual ?WIC Program: No ? ?Consult Status ?Consult Status: Follow-up ?Date: 07/18/21 ?Follow-up type: In-patient ? ? ? ?Melissa Cavness  Navarro ?07/17/2021, 2:08 PM ? ? ? ?

## 2021-07-17 NOTE — ED Triage Notes (Signed)
Pt c/o sudden onset fever, HA, dizziness, weakness, poor appetite since this AM. Vaginal delivery approx 2wks ago, no issues w pregnancy/delivery. Tylenol for symptoms approx 1415. Pt still endorses HA/photosensitivity, states it feels like migraine- hx of same ?

## 2021-07-17 NOTE — ED Notes (Signed)
Pt stated that she had to leave to go see her son's night shift nurses in the NICU. MD made aware, talked to pt about urine not being back yet, MD stated that he will notify her abut her urine results. Pt left before getting D/C papers. ?

## 2021-07-17 NOTE — Lactation Note (Signed)
This note was copied from a baby's chart. ?Lactation Consultation Note ? ?Patient Name: Melissa Navarro ?Today's Date: 07/17/2021 ?  ?Age:20 wk.o. ? ?MD in room with parents.  Lactation to follow up later today.  ? ? ? ?Melissa Navarro ?07/17/2021, 10:49 AM ? ? ? ?

## 2021-07-18 ENCOUNTER — Ambulatory Visit: Payer: Self-pay

## 2021-07-18 NOTE — Lactation Note (Addendum)
This note was copied from a baby's chart. ?Lactation Consultation Note ? ?Patient Name: Melissa Navarro ?Today's Date: 07/18/2021 ?Reason for consult: Follow-up assessment;Mother's request;Exclusive pumping and bottle feeding;Nipple pain/trauma;Breastfeeding assistance ?Age:20 wk.o. ?Mom currently pumping and dumping. Infant taking formula only with last feeding took 70 ml. ? ?Mom stated she did not feel well as to the reason  why she was dumping her pumped milk.  ? ?Mom stated infant had a procedure today, so she only pumped 3x. Mom breasts were full with some areas of hardness. Mom pumping with 24 flange, we increased from 27 to 30 states better fit. Mom pumped off total of 15 ounces.  ? ?Today, Mom still feels warm, RN took her temperature remains at 37 F. Mom left breast redness triangle  shape going towards the nipple, and left nipple appears pink in color compared to the right. These are new changes that was not evident one day prior. After pumping both breasts were soft and all hard areas had resolved, but redness on the left breast remained.  ?Mom instructed to go to the ER to have her breasts examined since the redness developed recently. Mom concerned leaving the room with infant alone. She wants to wait until Dad arrives.  ?At the end of the visit, Mom encouraged to pump with DEBP q 3hrs for 20 min on maintenance to empty her breasts and at least 2 x during the night. With both visits, Mom breasts are leaking milk. Breast pads provided to Mom as well.  ? ?Mom had been seen in the ER one day prior for IV fluids following fever. Mom encouraged to return to have her breasts examined.  ? ?Mom some discomfort with pumping but resolved with use of 30 flanges.  ?Mom had some pain in breast but resolved with pumping and massaging to empty breasts. ? ?Mom aware to call for lactations services if needed. Mom states once feeling better she wants to resume pumping and offer her EBM to infant. LC reviewed milk  storage, pumping frequency and washing pump parts in between use.  ? ?Mom encouraged to reach out to her OBGYN as for medical advice concerning her breasts.  ? ?Infant Pediatric RN aware of findings above.  ?All questions answered at the end of the visit.  ? ? ? ? ?Maternal Data ?  ? ?Feeding ?Mother's Current Feeding Choice: Formula ? ?LATCH Score ?  ? ?  ? ?  ? ?  ? ?  ? ?  ? ? ?Lactation Tools Discussed/Used ?Tools: Pump;Flanges ?Flange Size: 30 ?Breast pump type: Double-Electric Breast Pump ?Pump Education: Setup, frequency, and cleaning;Milk Storage ?Reason for Pumping: relieve fullness ?Pumping frequency: every 3 hrs for 20 min on maintenance ? ?Interventions ?Interventions: Breast feeding basics reviewed;Breast compression;Breast massage;DEBP;Education;Infant Driven Feeding Algorithm education ? ?Discharge ?Discharge Education: Engorgement and breast care ?Pump: DEBP ? ?Consult Status ?Consult Status: Follow-up ?Date: 07/19/21 ?Follow-up type: In-patient ? ? ? ?Melissa Fuston  Navarro ?07/18/2021, 8:58 PM ? ? ? ?

## 2021-07-19 ENCOUNTER — Other Ambulatory Visit (HOSPITAL_COMMUNITY): Payer: Self-pay

## 2021-07-19 ENCOUNTER — Other Ambulatory Visit: Payer: Self-pay

## 2021-07-19 ENCOUNTER — Inpatient Hospital Stay (EMERGENCY_DEPARTMENT_HOSPITAL)
Admission: AD | Admit: 2021-07-19 | Discharge: 2021-07-19 | Disposition: A | Discharge status: Home / Self Care | Payer: Medicaid Other | Source: Home / Self Care | Attending: Obstetrics & Gynecology | Admitting: Obstetrics & Gynecology

## 2021-07-19 ENCOUNTER — Emergency Department (HOSPITAL_COMMUNITY): Payer: Medicaid Other

## 2021-07-19 ENCOUNTER — Inpatient Hospital Stay (HOSPITAL_COMMUNITY)
Admission: EM | Admit: 2021-07-19 | Discharge: 2021-07-19 | Disposition: A | Discharge status: Home / Self Care | Payer: Medicaid Other | Attending: Family Medicine | Admitting: Family Medicine

## 2021-07-19 ENCOUNTER — Encounter (HOSPITAL_COMMUNITY): Payer: Self-pay | Admitting: Obstetrics & Gynecology

## 2021-07-19 DIAGNOSIS — O9122 Nonpurulent mastitis associated with the puerperium: Secondary | ICD-10-CM | POA: Diagnosis not present

## 2021-07-19 DIAGNOSIS — N939 Abnormal uterine and vaginal bleeding, unspecified: Secondary | ICD-10-CM

## 2021-07-19 DIAGNOSIS — R079 Chest pain, unspecified: Secondary | ICD-10-CM | POA: Diagnosis not present

## 2021-07-19 DIAGNOSIS — R509 Fever, unspecified: Secondary | ICD-10-CM | POA: Diagnosis not present

## 2021-07-19 DIAGNOSIS — N179 Acute kidney failure, unspecified: Secondary | ICD-10-CM | POA: Diagnosis not present

## 2021-07-19 DIAGNOSIS — E876 Hypokalemia: Secondary | ICD-10-CM | POA: Diagnosis not present

## 2021-07-19 DIAGNOSIS — N644 Mastodynia: Secondary | ICD-10-CM | POA: Diagnosis not present

## 2021-07-19 LAB — CBC WITH DIFFERENTIAL/PLATELET
Abs Immature Granulocytes: 0.03 10*3/uL (ref 0.00–0.07)
Basophils Absolute: 0 10*3/uL (ref 0.0–0.1)
Basophils Relative: 1 %
Eosinophils Absolute: 0.4 10*3/uL (ref 0.0–0.5)
Eosinophils Relative: 5 %
HCT: 33.4 % — ABNORMAL LOW (ref 36.0–46.0)
Hemoglobin: 11 g/dL — ABNORMAL LOW (ref 12.0–15.0)
Immature Granulocytes: 0 %
Lymphocytes Relative: 15 %
Lymphs Abs: 1.1 10*3/uL (ref 0.7–4.0)
MCH: 29.7 pg (ref 26.0–34.0)
MCHC: 32.9 g/dL (ref 30.0–36.0)
MCV: 90.3 fL (ref 80.0–100.0)
Monocytes Absolute: 0.3 10*3/uL (ref 0.1–1.0)
Monocytes Relative: 5 %
Neutro Abs: 5.4 10*3/uL (ref 1.7–7.7)
Neutrophils Relative %: 74 %
Platelets: 235 10*3/uL (ref 150–400)
RBC: 3.7 MIL/uL — ABNORMAL LOW (ref 3.87–5.11)
RDW: 12.9 % (ref 11.5–15.5)
WBC: 7.2 10*3/uL (ref 4.0–10.5)
nRBC: 0 % (ref 0.0–0.2)

## 2021-07-19 LAB — COMPREHENSIVE METABOLIC PANEL
ALT: 26 U/L (ref 0–44)
AST: 14 U/L — ABNORMAL LOW (ref 15–41)
Albumin: 2.6 g/dL — ABNORMAL LOW (ref 3.5–5.0)
Alkaline Phosphatase: 72 U/L (ref 38–126)
Anion gap: 6 (ref 5–15)
BUN: <5 mg/dL — ABNORMAL LOW (ref 6–20)
CO2: 24 mmol/L (ref 22–32)
Calcium: 8.8 mg/dL — ABNORMAL LOW (ref 8.9–10.3)
Chloride: 112 mmol/L — ABNORMAL HIGH (ref 98–111)
Creatinine, Ser: 0.79 mg/dL (ref 0.44–1.00)
GFR, Estimated: >60 mL/min (ref >60)
Glucose, Bld: 108 mg/dL — ABNORMAL HIGH (ref 70–99)
Potassium: 3.3 mmol/L — ABNORMAL LOW (ref 3.5–5.1)
Sodium: 142 mmol/L (ref 135–145)
Total Bilirubin: 0.5 mg/dL (ref 0.3–1.2)
Total Protein: 5.8 g/dL — ABNORMAL LOW (ref 6.5–8.1)

## 2021-07-19 LAB — URINALYSIS, ROUTINE W REFLEX MICROSCOPIC
Bacteria, UA: NONE SEEN
Bilirubin Urine: NEGATIVE
Glucose, UA: NEGATIVE mg/dL
Ketones, ur: NEGATIVE mg/dL
Nitrite: NEGATIVE
Protein, ur: 30 mg/dL — AB
Specific Gravity, Urine: 1.03 (ref 1.005–1.030)
pH: 5 (ref 5.0–8.0)

## 2021-07-19 LAB — URINE CULTURE: Special Requests: NORMAL

## 2021-07-19 LAB — LACTIC ACID, PLASMA: Lactic Acid, Venous: 1.5 mmol/L (ref 0.5–1.9)

## 2021-07-19 MED ORDER — CEFADROXIL 500 MG PO CAPS
500.0000 mg | ORAL_CAPSULE | Freq: Two times a day (BID) | ORAL | 0 refills | Status: AC
  Filled 2021-07-19: qty 14, 7d supply, fill #0

## 2021-07-19 NOTE — ED Notes (Signed)
This tech was informed by registration that pt left. ?

## 2021-07-19 NOTE — MAU Note (Signed)
Melissa Navarro is a 21 y.o. here in MAU reporting: PP SVD on 06/29/2021. Having some soreness and redness on both breasts, states more on the left. Is breast feeding and pumping. Reports she had a fever.  ? ?Onset of complaint: ongoing ? ?Pain score: 6/10 ? ?Vitals:  ? 07/19/21 1042 07/19/21 1416  ?BP: (!) 105/94 118/81  ?Pulse: 87 68  ?Resp: 17 16  ?Temp: 98.3 ?F (36.8 ?C) 98.5 ?F (36.9 ?C)  ?SpO2: 99% 98%  ?   ?Lab orders placed from triage: none ? ?

## 2021-07-19 NOTE — MAU Provider Note (Signed)
?History  ?  ?[id number] ? ?Arrival date and time: 07/19/21 2127 ?  ?Chief Complaint  ?Patient presents with  ? Vaginal Bleeding  ? ?HPI ?Melissa Navarro is a 21 y.o. G1P1001 s/p SVD on 3/16 who presents to MAU for vaginal bleeding.  ? ?Patient was seen in MAU earlier today for mastitis. Prescribed Keflex, no issues with this currently.  ? ?She states that she was holding her baby earlier today when she felt something coming out of her vagina. Reports that she had a golf ball sized blood clot come out. She denies any additional clots or heavy bleeding since then. Reports just normal spotting, similar to a light period. She denies abdominal pain. She is eating and drinking well. No issues with voiding. Denies fever. She has no other concerns at this time.  ? ?--/--/O POS (03/16 4259) ? ?OB History   ? ? Gravida  ?1  ? Para  ?1  ? Term  ?1  ? Preterm  ?   ? AB  ?   ? Living  ?1  ?  ? ? SAB  ?   ? IAB  ?   ? Ectopic  ?   ? Multiple  ?0  ? Live Births  ?1  ?   ?  ?  ? ? ?Past Medical History:  ?Diagnosis Date  ? ADHD (attention deficit hyperactivity disorder)   ? Anxiety   ? Depression   ? ? ?No past surgical history on file. ? ?Family History  ?Problem Relation Age of Onset  ? Stomach cancer Maternal Aunt   ? Stomach cancer Maternal Grandmother   ? ? ?Social History  ? ?Socioeconomic History  ? Marital status: Single  ?  Spouse name: Not on file  ? Number of children: Not on file  ? Years of education: Not on file  ? Highest education level: Not on file  ?Occupational History  ? Not on file  ?Tobacco Use  ? Smoking status: Never  ? Smokeless tobacco: Never  ?Vaping Use  ? Vaping Use: Never used  ?Substance and Sexual Activity  ? Alcohol use: Not Currently  ? Drug use: Yes  ?  Types: Marijuana  ? Sexual activity: Yes  ?  Birth control/protection: None  ?Other Topics Concern  ? Not on file  ?Social History Narrative  ? Not on file  ? ?Social Determinants of Health  ? ?Financial Resource Strain: Not on file  ?Food  Insecurity: Not on file  ?Transportation Needs: Not on file  ?Physical Activity: Not on file  ?Stress: Not on file  ?Social Connections: Not on file  ?Intimate Partner Violence: Not on file  ? ? ?No Known Allergies ? ?No current facility-administered medications on file prior to encounter.  ? ?Current Outpatient Medications on File Prior to Encounter  ?Medication Sig Dispense Refill  ? cefadroxil (DURICEF) 500 MG capsule Take 1 capsule (500 mg total) by mouth 2 (two) times daily for 7 days. 14 capsule 0  ? ibuprofen (ADVIL) 600 MG tablet Take 1 tablet (600 mg total) by mouth every 6 (six) hours. 30 tablet 0  ? Prenatal Vit-Fe Fumarate-FA (MULTIVITAMIN-PRENATAL) 27-0.8 MG TABS tablet Take 1 tablet by mouth daily at 12 noon.    ? ?ROS ?Pertinent positives and negative per HPI, all others reviewed and negative. ? ?Physical Exam  ? ?BP 119/72   Pulse 76   Temp 98.4 ?F (36.9 ?C) (Oral)   Resp 18   Ht 5\' 2"  (1.575 m)  Wt 65.3 kg   SpO2 97%   Breastfeeding No   BMI 26.34 kg/m?  ? ?Physical Exam ?Constitutional:   ?   General: She is not in acute distress. ?   Appearance: Normal appearance.  ?HENT:  ?   Head: Normocephalic and atraumatic.  ?   Mouth/Throat:  ?   Mouth: Mucous membranes are moist.  ?Eyes:  ?   Extraocular Movements: Extraocular movements intact.  ?   Conjunctiva/sclera: Conjunctivae normal.  ?Cardiovascular:  ?   Rate and Rhythm: Normal rate.  ?Pulmonary:  ?   Effort: Pulmonary effort is normal.  ?Abdominal:  ?   Palpations: Abdomen is soft.  ?   Tenderness: There is no abdominal tenderness. There is no guarding or rebound.  ?   Comments: Fundus firm and well below umbilicus  ?Genitourinary: ?   General: Normal vulva.  ?   Comments: Scant blood on pad, no bleeding noted with fundal rub ?Musculoskeletal:     ?   General: Normal range of motion.  ?   Right lower leg: No edema.  ?   Left lower leg: No edema.  ?Skin: ?   General: Skin is warm and dry.  ?Neurological:  ?   General: No focal deficit  present.  ?   Mental Status: She is alert and oriented to person, place, and time.  ?Psychiatric:     ?   Mood and Affect: Mood normal.     ?   Behavior: Behavior normal.  ? ?MAU Course  ?Procedures ?Lab Orders  ?No laboratory test(s) ordered today  ? ?No orders of the defined types were placed in this encounter. ? ?Imaging Orders  ?No imaging studies ordered today  ? ?MDM ?Patient presents to MAU after passing blood clot earlier today ?Upon arrival, VSS, no acute distress ?No additional blood clots or heavy bleeding since then, no fever ?Exam as above benign, normal postpartum bleeding noted on pad, no significant bleeding noted with fundal rub ?No signs of endometritis ?Overall consistent with normal bleeding in postpartum period  ? ?Assessment and Plan  ? ?1. Vaginal bleeding ?- Normal postpartum vaginal bleeding, provided reassurance  ?- Stable for discharge, return precautions reviewed ?- All questions and concerns addressed, patient voiced understanding  ? ?Worthy Rancher, MD ? ?

## 2021-07-19 NOTE — MAU Provider Note (Signed)
Chief Complaint: Breast Pain and Fever ? ? None  ?  ? ?SUBJECTIVE ?HPI: Melissa Navarro is a 21 y.o. G1P1001 who is 2 weeks postpartum following a vaginal delivery who presents to maternity admissions reporting intermittent fever with evaluation in ED on 07/17/21, breast tenderness and redness starting 07/18/21.  Lactation saw patient on 07/18/21 and recommended she come to MAU if fever returned and she continued to have breast pain.  She was in PICU today, as her baby is readmitted to the hospital, and the nurse from PICU brought her to the ED. The wait today was long so she left and walked over to MAU.   ? ? ?HPI ? ?Past Medical History:  ?Diagnosis Date  ? ADHD (attention deficit hyperactivity disorder)   ? Anxiety   ? Depression   ? ?No past surgical history on file. ?Social History  ? ?Socioeconomic History  ? Marital status: Single  ?  Spouse name: Not on file  ? Number of children: Not on file  ? Years of education: Not on file  ? Highest education level: Not on file  ?Occupational History  ? Not on file  ?Tobacco Use  ? Smoking status: Never  ? Smokeless tobacco: Never  ?Vaping Use  ? Vaping Use: Never used  ?Substance and Sexual Activity  ? Alcohol use: Not Currently  ? Drug use: Yes  ?  Types: Marijuana  ? Sexual activity: Yes  ?  Birth control/protection: None  ?Other Topics Concern  ? Not on file  ?Social History Narrative  ? Not on file  ? ?Social Determinants of Health  ? ?Financial Resource Strain: Not on file  ?Food Insecurity: Not on file  ?Transportation Needs: Not on file  ?Physical Activity: Not on file  ?Stress: Not on file  ?Social Connections: Not on file  ?Intimate Partner Violence: Not on file  ? ?No current facility-administered medications on file prior to encounter.  ? ?Current Outpatient Medications on File Prior to Encounter  ?Medication Sig Dispense Refill  ? ibuprofen (ADVIL) 600 MG tablet Take 1 tablet (600 mg total) by mouth every 6 (six) hours. 30 tablet 0  ? Prenatal Vit-Fe Fumarate-FA  (MULTIVITAMIN-PRENATAL) 27-0.8 MG TABS tablet Take 1 tablet by mouth daily at 12 noon.    ? ?No Known Allergies ? ?ROS:  ?Review of Systems  ?Constitutional:  Negative for chills, fatigue and fever.  ?Respiratory:  Negative for shortness of breath.   ?Cardiovascular:  Negative for chest pain.  ?Genitourinary:  Negative for difficulty urinating, dysuria, flank pain, pelvic pain, vaginal bleeding, vaginal discharge and vaginal pain.  ?Skin:  Positive for color change.  ?     Redness of both breasts  ?Neurological:  Negative for dizziness and headaches.  ?Psychiatric/Behavioral: Negative.    ? ? ?I have reviewed patient's Past Medical Hx, Surgical Hx, Family Hx, Social Hx, medications and allergies.  ? ?Physical Exam  ?Patient Vitals for the past 24 hrs: ? BP Temp Temp src Pulse Resp SpO2  ?07/19/21 1416 118/81 98.5 ?F (36.9 ?C) Oral 68 16 98 %  ?07/19/21 1042 (!) 105/94 98.3 ?F (36.8 ?C) -- 87 17 99 %  ? ?Constitutional: Well-developed, well-nourished female in no acute distress.  ?Cardiovascular: normal rate ?Respiratory: normal effort ?Breast exam: on visual inspection, left breast with mild erythema, on exam, right breast with tenderness and palpable smooth mass at 9 o'clock near edge of areola ? ?GI: Abd soft, non-tender. Pos BS x 4 ?MS: Extremities nontender, no edema, normal  ROM ?Neurologic: Alert and oriented x 4.  ?GU: Neg CVAT. ? ? ?LAB RESULTS ?Results for orders placed or performed during the hospital encounter of 07/19/21 (from the past 24 hour(s))  ?Urinalysis, Routine w reflex microscopic     Status: Abnormal  ? Collection Time: 07/19/21 11:14 AM  ?Result Value Ref Range  ? Color, Urine YELLOW YELLOW  ? APPearance HAZY (A) CLEAR  ? Specific Gravity, Urine 1.030 1.005 - 1.030  ? pH 5.0 5.0 - 8.0  ? Glucose, UA NEGATIVE NEGATIVE mg/dL  ? Hgb urine dipstick MODERATE (A) NEGATIVE  ? Bilirubin Urine NEGATIVE NEGATIVE  ? Ketones, ur NEGATIVE NEGATIVE mg/dL  ? Protein, ur 30 (A) NEGATIVE mg/dL  ? Nitrite  NEGATIVE NEGATIVE  ? Leukocytes,Ua MODERATE (A) NEGATIVE  ? RBC / HPF 0-5 0 - 5 RBC/hpf  ? WBC, UA 21-50 0 - 5 WBC/hpf  ? Bacteria, UA NONE SEEN NONE SEEN  ? Squamous Epithelial / LPF 0-5 0 - 5  ? Mucus PRESENT   ? Amorphous Crystal PRESENT   ?Lactic acid, plasma     Status: None  ? Collection Time: 07/19/21 11:29 AM  ?Result Value Ref Range  ? Lactic Acid, Venous 1.5 0.5 - 1.9 mmol/L  ?Comprehensive metabolic panel     Status: Abnormal  ? Collection Time: 07/19/21 11:29 AM  ?Result Value Ref Range  ? Sodium 142 135 - 145 mmol/L  ? Potassium 3.3 (L) 3.5 - 5.1 mmol/L  ? Chloride 112 (H) 98 - 111 mmol/L  ? CO2 24 22 - 32 mmol/L  ? Glucose, Bld 108 (H) 70 - 99 mg/dL  ? BUN <5 (L) 6 - 20 mg/dL  ? Creatinine, Ser 0.79 0.44 - 1.00 mg/dL  ? Calcium 8.8 (L) 8.9 - 10.3 mg/dL  ? Total Protein 5.8 (L) 6.5 - 8.1 g/dL  ? Albumin 2.6 (L) 3.5 - 5.0 g/dL  ? AST 14 (L) 15 - 41 U/L  ? ALT 26 0 - 44 U/L  ? Alkaline Phosphatase 72 38 - 126 U/L  ? Total Bilirubin 0.5 0.3 - 1.2 mg/dL  ? GFR, Estimated >60 >60 mL/min  ? Anion gap 6 5 - 15  ?CBC with Differential     Status: Abnormal  ? Collection Time: 07/19/21 11:29 AM  ?Result Value Ref Range  ? WBC 7.2 4.0 - 10.5 K/uL  ? RBC 3.70 (L) 3.87 - 5.11 MIL/uL  ? Hemoglobin 11.0 (L) 12.0 - 15.0 g/dL  ? HCT 33.4 (L) 36.0 - 46.0 %  ? MCV 90.3 80.0 - 100.0 fL  ? MCH 29.7 26.0 - 34.0 pg  ? MCHC 32.9 30.0 - 36.0 g/dL  ? RDW 12.9 11.5 - 15.5 %  ? Platelets 235 150 - 400 K/uL  ? nRBC 0.0 0.0 - 0.2 %  ? Neutrophils Relative % 74 %  ? Neutro Abs 5.4 1.7 - 7.7 K/uL  ? Lymphocytes Relative 15 %  ? Lymphs Abs 1.1 0.7 - 4.0 K/uL  ? Monocytes Relative 5 %  ? Monocytes Absolute 0.3 0.1 - 1.0 K/uL  ? Eosinophils Relative 5 %  ? Eosinophils Absolute 0.4 0.0 - 0.5 K/uL  ? Basophils Relative 1 %  ? Basophils Absolute 0.0 0.0 - 0.1 K/uL  ? Immature Granulocytes 0 %  ? Abs Immature Granulocytes 0.03 0.00 - 0.07 K/uL  ? ? ?--/--/O POS (03/16 [id number]) ? ?IMAGING ?DG Chest 2 View ? ?Result Date: 07/19/2021 ?CLINICAL DATA:   Breast pain. EXAM: CHEST - 2 VIEW COMPARISON:  September 24, 2006 FINDINGS: The heart size and mediastinal contours are within normal limits. No focal consolidation. No pleural effusion. No pneumothorax. The visualized skeletal structures are unremarkable. IMPRESSION: No active cardiopulmonary disease. Electronically Signed   By: Maudry Mayhew M.D.   On: 07/19/2021 11:47   ? ?MAU Management/MDM: ?Orders Placed This Encounter  ?Procedures  ? Urine Culture  ? DG Chest 2 View  ? Comprehensive metabolic panel  ? CBC with Differential  ? Urinalysis, Routine w reflex microscopic  ? Saline Lock IV, Maintain IV access  ? Discharge patient  ? Discharge patient Discharge disposition: 01-Home or Self Care; Discharge patient date: 07/19/2021  ?  ?Meds ordered this encounter  ?Medications  ? cefadroxil (DURICEF) 500 MG capsule  ?  Sig: Take 1 capsule (500 mg total) by mouth 2 (two) times daily for 7 days.  ?  Dispense:  14 capsule  ?  Refill:  0  ?  Order Specific Question:   Supervising Provider  ?  Answer:   Reva Bores [[id number]]  ?  ?Pt with mild breast symptoms in the setting of recent intermittent fever. UA with moderate leukocytes but no nitrites, so no clear evidence of UTI.  Will send urine for culture and treat for mastitis with cephalosporin, likely to cover UTI as well.  Pt is pumping, baby is not feeding well and she is going back and forth from the hospital to home with her infant admitted to the ICU.  Mastitis with poor feeding is likely source of pt symptoms.  Pt encouraged to stay hydrated, and to empty breasts regularly.  It is safe to breastfeed with mastitis and with abx prescribed.  Precautions/reasons to seek care given. Present to MAU within 6 weeks postpartum.   ? ?ASSESSMENT ?1. Mastitis associated with childbirth, delivered   ? ? ?PLAN ?Discharge home ?Allergies as of 07/19/2021   ?No Known Allergies ?  ? ?  ?Medication List  ?  ? ?STOP taking these medications   ? ?cephALEXin 500 MG capsule ?Commonly known as:  Keflex ?  ? ?  ? ?TAKE these medications   ? ?cefadroxil 500 MG capsule ?Commonly known as: DURICEF ?Take 1 capsule (500 mg total) by mouth 2 (two) times daily for 7 days. ?  ?ibuprofen 600 MG tablet ?Comm

## 2021-07-19 NOTE — MAU Note (Signed)
..  Melissa Navarro is a 21 y.o. at Unknown here in MAU reporting: PP 2 weeks ago and lochia has been just spotting since the last 2 weeks. Today pt report dark red VB with one golf ball sized clot. Pt report VB continued but has slowed down. Pt having some cramping upper ABD. Pt wearing a pad. ? ?Onset of complaint: 2130 ?Pain score: 04/10 ?Vitals:  ? 07/19/21 2229 07/19/21 2230  ?BP:  119/72  ?Pulse: 76   ?Resp: 18   ?SpO2: 97%   ?   ? ?Lab orders placed from triage:  none ? ?

## 2021-07-19 NOTE — ED Triage Notes (Addendum)
Pt. Stated, The lactaing nurse sent me down to be checked for mastitis , both my breast are red and swelling, I had a fever 2 days ago. But gone now. ?

## 2021-07-26 ENCOUNTER — Ambulatory Visit: Payer: Medicaid Other | Admitting: Clinical

## 2021-07-26 DIAGNOSIS — Z91199 Patient's noncompliance with other medical treatment and regimen due to unspecified reason: Secondary | ICD-10-CM

## 2021-08-01 ENCOUNTER — Encounter: Payer: Self-pay | Admitting: Advanced Practice Midwife

## 2021-08-01 ENCOUNTER — Ambulatory Visit (INDEPENDENT_AMBULATORY_CARE_PROVIDER_SITE_OTHER): Payer: Medicaid Other | Admitting: Advanced Practice Midwife

## 2021-08-01 VITALS — BP 120/79 | HR 84 | Ht 62.0 in | Wt 139.0 lb

## 2021-08-01 DIAGNOSIS — Z9189 Other specified personal risk factors, not elsewhere classified: Secondary | ICD-10-CM | POA: Diagnosis not present

## 2021-08-01 NOTE — Progress Notes (Signed)
? ? ?Post Partum Visit Note ? ?Melissa Navarro is a 21 y.o. G37P1001 female who presents for a postpartum visit. She is 4 weeks postpartum following a normal spontaneous vaginal delivery.  I have fully reviewed the prenatal and intrapartum course. The delivery was at 39.2 gestational weeks.  Anesthesia: none. Postpartum course has been unremarkable. Baby is doing well- was diagnosed with SVT and had to spend some time in the hospital but, is home now. Baby is feeding by bottle - Gerber Gentle . Bleeding no bleeding. Bowel function is normal. Bladder function is normal. Patient is sexually active. Contraception method is Nexplanon- inserted in hospital. Postpartum depression screening: positive- score 13. ? ? ?The pregnancy intention screening data noted above was reviewed. Potential methods of contraception were discussed. The patient elected to proceed with No data recorded. ? ? Edinburgh Postnatal Depression Scale - 08/01/21 1319   ? ?  ? Edinburgh Postnatal Depression Scale:  In the Past 7 Days  ? I have been able to laugh and see the funny side of things. 0   ? I have looked forward with enjoyment to things. 0   ? I have blamed myself unnecessarily when things went wrong. 2   ? I have been anxious or worried for no good reason. 2   ? I have felt scared or panicky for no good reason. 2   ? Things have been getting on top of me. 2   ? I have been so unhappy that I have had difficulty sleeping. 2   ? I have felt sad or miserable. 2   ? I have been so unhappy that I have been crying. 1   ? The thought of harming myself has occurred to me. 0   ? Edinburgh Postnatal Depression Scale Total 13   ? ?  ?  ? ?  ? ? ?Health Maintenance Due  ?Topic Date Due  ? COVID-19 Vaccine (1) Never done  ? HPV VACCINES (1 - 2-dose series) Never done  ? TETANUS/TDAP  Never done  ? ? ?The following portions of the patient's history were reviewed and updated as appropriate: allergies, current medications, past family history, past medical  history, past social history, past surgical history, and problem list. ? ?Review of Systems ?Pertinent items noted in HPI and remainder of comprehensive ROS otherwise negative. ? ?Objective:  ?BP 120/79   Pulse 84   Ht 5\' 2"  (1.575 m)   Wt 139 lb (63 kg)   Breastfeeding No   BMI 25.42 kg/m?   ?VS reviewed, nursing note reviewed,  ?Constitutional: well developed, well nourished, no distress ?HEENT: normocephalic ?CV: normal rate ?Pulm/chest wall: normal effort ?Abdomen: soft ?Neuro: alert and oriented x 3 ?Skin: warm, dry ?Psych: affect normal  ? ?Assessment:  ? ?1. At risk for postpartum depression ?--High score on PPD scale but pt reports she is overall doing well. Baby was in PICU causing a lot of stress but is doing well. ?--She has hx depression/anxiety but did not do well on medications.   ?--She missed appt with Roselyn Reef (scheduled from hospital at discharge) due to her baby's appointments ?--Pt to f/u with Seth Bake for counseling, will consider restarting medication if needed ?--Mood check with me in 4-6 weeks ?- Ambulatory referral to [facility] ? ?2. Postpartum examination following vaginal delivery ? ? ? ?Plan:  ? ?Essential components of care per ACOG recommendations: ? ?1.  Mood and well being: Patient with positive depression screening today. Reviewed local resources  for support.  ?- Patient tobacco use? No.   ?- hx of drug use? No.   ? ?2. Infant care and feeding:  ?-Patient currently breastmilk feeding? No.  Stopped after tx for mastitis, discussed and pt prefers to bottlefeed at this time. No breast complaints. ?-Social determinants of health (SDOH) reviewed in EPIC. No concerns ? ? ?3. Sexuality, contraception and birth spacing ?- Patient does not want a pregnancy in the next year.   ?- Reviewed reproductive life planning. Pt had Nexplanon placed inpatient. ? ?4. Sleep and fatigue ?-Encouraged family/partner/community support of 4 hrs of uninterrupted sleep to help with mood and  fatigue ? ?5. Physical Recovery  ?- Discussed patients delivery and complications. She describes her labor as good. ?- Patient had a Vaginal, no problems at delivery. Patient had superficial labial laceration without repair. Perineal healing reviewed. Patient expressed understanding ?- Patient has urinary incontinence? No. ?- Patient is safe to resume physical and sexual activity ? ?6.  Health Maintenance ?- HM due items addressed Yes ?- Last pap smear n/a due to age ?today's visit. -Breast Cancer screening indicated? No.  ? ?7. Chronic Disease/Pregnancy Condition follow up: None ? ?- PCP follow up ? ?Fatima Blank, CNM ?[facility] [place]  ?

## 2021-08-07 ENCOUNTER — Ambulatory Visit (INDEPENDENT_AMBULATORY_CARE_PROVIDER_SITE_OTHER): Payer: Medicaid Other | Admitting: Licensed Clinical Social Worker

## 2021-08-07 DIAGNOSIS — Z9189 Other specified personal risk factors, not elsewhere classified: Secondary | ICD-10-CM

## 2021-08-07 DIAGNOSIS — Z1331 Encounter for screening for depression: Secondary | ICD-10-CM | POA: Diagnosis not present

## 2021-08-07 NOTE — BH Specialist Note (Signed)
Integrated Behavioral Health via Telemedicine Visit ? ?08/07/2021 ?Melissa Navarro ?[id number] ? ?Number of Integrated Behavioral Health Clinician visits: 2- Second Visit ? ?Session Start time: 0210 ?  ?Session End time: 0238 ? ?Total time in minutes: 28 ?Via phone per pt request to bottle feed  ? ?Referring Provider: Bonna Gains  ?Patient/Family location: Home  ?[facility] Provider location: [facility]  ?All persons participating in visit: pt N Mckesson and LCSW A. Felton Clinton  ?Types of Service: Telephone visit and individual psychotherapy  ? ?I connected with Melissa Navarro and/or Melissa Navarro's n/a via  Telephone or Video Enabled Telemedicine Application  (Video is Caregility application) and verified that I am speaking with the correct person using two identifiers. Discussed confidentiality: Yes  ? ?I discussed the limitations of telemedicine and the availability of in person appointments.  Discussed there is a possibility of technology failure and discussed alternative modes of communication if that failure occurs. ? ?I discussed that engaging in this telemedicine visit, they consent to the provision of behavioral healthcare and the services will be billed under their insurance. ? ?Patient and/or legal guardian expressed understanding and consented to Telemedicine visit: Yes  ? ?Presenting Concerns: ?Patient and/or family reports the following symptoms/concerns: overwhelmed, stress and  ?Duration of problem: one month; Severity of problem: mild ? ?Patient and/or Family's Strengths/Protective Factors: ?Concrete supports in place (healthy food, safe environments, etc.) ? ?Goals Addressed: ?Patient will: ? Reduce symptoms of: stress  ? Increase knowledge and/or ability of: coping skills and stress reduction  ? Demonstrate ability to: Increase adequate support systems for patient/family ? ?Progress towards Goals: ?Ongoing ? ?Interventions: ?Interventions utilized:  Supportive Counseling ?Standardized Assessments completed:  Edinburgh Postnatal Depression ? ? ?Assessment: ?Patient currently experiencing postpartum depression .  ? ?Patient may benefit from integrated behavioral health. ? ?Plan: ?Follow up with behavioral health clinician on : 3 weeks  ?Behavioral recommendations: Community need to additional help, collaborate with pediatric specialist ?Referral(s): Community Resources:  support groups  ? ?I discussed the assessment and treatment plan with the patient and/or parent/guardian. They were provided an opportunity to ask questions and all were answered. They agreed with the plan and demonstrated an understanding of the instructions. ?  ?They were advised to call back or seek an in-person evaluation if the symptoms worsen or if the condition fails to improve as anticipated. ? ?Gwyndolyn Saxon, LCSW ?

## 2021-08-14 DIAGNOSIS — Z419 Encounter for procedure for purposes other than remedying health state, unspecified: Secondary | ICD-10-CM | POA: Diagnosis not present

## 2021-08-29 ENCOUNTER — Telehealth (INDEPENDENT_AMBULATORY_CARE_PROVIDER_SITE_OTHER): Payer: Self-pay | Admitting: Advanced Practice Midwife

## 2021-08-29 ENCOUNTER — Encounter (HOSPITAL_BASED_OUTPATIENT_CLINIC_OR_DEPARTMENT_OTHER): Payer: Self-pay

## 2021-08-29 DIAGNOSIS — Z9189 Other specified personal risk factors, not elsewhere classified: Secondary | ICD-10-CM

## 2021-08-29 NOTE — Progress Notes (Signed)
Pt logged on to MyChart visit but was not available when provider logged on.  Called and left message, pt unavailable.  Will reschedule appt.   ?

## 2021-09-14 DIAGNOSIS — Z419 Encounter for procedure for purposes other than remedying health state, unspecified: Secondary | ICD-10-CM | POA: Diagnosis not present

## 2021-10-14 DIAGNOSIS — Z419 Encounter for procedure for purposes other than remedying health state, unspecified: Secondary | ICD-10-CM | POA: Diagnosis not present

## 2021-11-14 DIAGNOSIS — Z419 Encounter for procedure for purposes other than remedying health state, unspecified: Secondary | ICD-10-CM | POA: Diagnosis not present

## 2021-12-15 DIAGNOSIS — Z419 Encounter for procedure for purposes other than remedying health state, unspecified: Secondary | ICD-10-CM | POA: Diagnosis not present

## 2021-12-28 DIAGNOSIS — J029 Acute pharyngitis, unspecified: Secondary | ICD-10-CM | POA: Diagnosis not present

## 2021-12-28 DIAGNOSIS — Z20822 Contact with and (suspected) exposure to covid-19: Secondary | ICD-10-CM | POA: Diagnosis not present

## 2021-12-28 DIAGNOSIS — R519 Headache, unspecified: Secondary | ICD-10-CM | POA: Diagnosis not present

## 2022-01-14 DIAGNOSIS — Z419 Encounter for procedure for purposes other than remedying health state, unspecified: Secondary | ICD-10-CM | POA: Diagnosis not present

## 2022-02-14 DIAGNOSIS — Z419 Encounter for procedure for purposes other than remedying health state, unspecified: Secondary | ICD-10-CM | POA: Diagnosis not present

## 2022-03-16 DIAGNOSIS — Z419 Encounter for procedure for purposes other than remedying health state, unspecified: Secondary | ICD-10-CM | POA: Diagnosis not present

## 2022-03-28 DIAGNOSIS — Z113 Encounter for screening for infections with a predominantly sexual mode of transmission: Secondary | ICD-10-CM | POA: Diagnosis not present

## 2022-03-28 DIAGNOSIS — N898 Other specified noninflammatory disorders of vagina: Secondary | ICD-10-CM | POA: Diagnosis not present

## 2022-03-28 DIAGNOSIS — A5901 Trichomonal vulvovaginitis: Secondary | ICD-10-CM | POA: Diagnosis not present

## 2022-03-28 DIAGNOSIS — B3731 Acute candidiasis of vulva and vagina: Secondary | ICD-10-CM | POA: Diagnosis not present

## 2022-03-28 DIAGNOSIS — A5402 Gonococcal vulvovaginitis, unspecified: Secondary | ICD-10-CM | POA: Diagnosis not present

## 2022-03-28 DIAGNOSIS — Z3202 Encounter for pregnancy test, result negative: Secondary | ICD-10-CM | POA: Diagnosis not present

## 2022-04-05 DIAGNOSIS — A549 Gonococcal infection, unspecified: Secondary | ICD-10-CM | POA: Diagnosis not present

## 2022-04-16 DIAGNOSIS — Z419 Encounter for procedure for purposes other than remedying health state, unspecified: Secondary | ICD-10-CM | POA: Diagnosis not present

## 2022-05-17 DIAGNOSIS — Z419 Encounter for procedure for purposes other than remedying health state, unspecified: Secondary | ICD-10-CM | POA: Diagnosis not present

## 2022-06-15 DIAGNOSIS — Z419 Encounter for procedure for purposes other than remedying health state, unspecified: Secondary | ICD-10-CM | POA: Diagnosis not present

## 2022-06-16 DIAGNOSIS — N760 Acute vaginitis: Secondary | ICD-10-CM | POA: Diagnosis not present

## 2022-06-16 DIAGNOSIS — Z202 Contact with and (suspected) exposure to infections with a predominantly sexual mode of transmission: Secondary | ICD-10-CM | POA: Diagnosis not present

## 2022-06-16 DIAGNOSIS — Z3202 Encounter for pregnancy test, result negative: Secondary | ICD-10-CM | POA: Diagnosis not present

## 2022-07-16 DIAGNOSIS — Z419 Encounter for procedure for purposes other than remedying health state, unspecified: Secondary | ICD-10-CM | POA: Diagnosis not present

## 2022-08-15 DIAGNOSIS — Z419 Encounter for procedure for purposes other than remedying health state, unspecified: Secondary | ICD-10-CM | POA: Diagnosis not present

## 2022-09-15 DIAGNOSIS — Z419 Encounter for procedure for purposes other than remedying health state, unspecified: Secondary | ICD-10-CM | POA: Diagnosis not present

## 2022-10-15 DIAGNOSIS — Z419 Encounter for procedure for purposes other than remedying health state, unspecified: Secondary | ICD-10-CM | POA: Diagnosis not present

## 2022-11-15 DIAGNOSIS — Z419 Encounter for procedure for purposes other than remedying health state, unspecified: Secondary | ICD-10-CM | POA: Diagnosis not present

## 2022-11-18 IMAGING — CR DG CHEST 2V
2 series · 2 of 2 positions shown · non-contrast
Comparison: September 24, 2006

CLINICAL DATA: Breast pain.

EXAM:
CHEST - 2 VIEW

[chest pa]
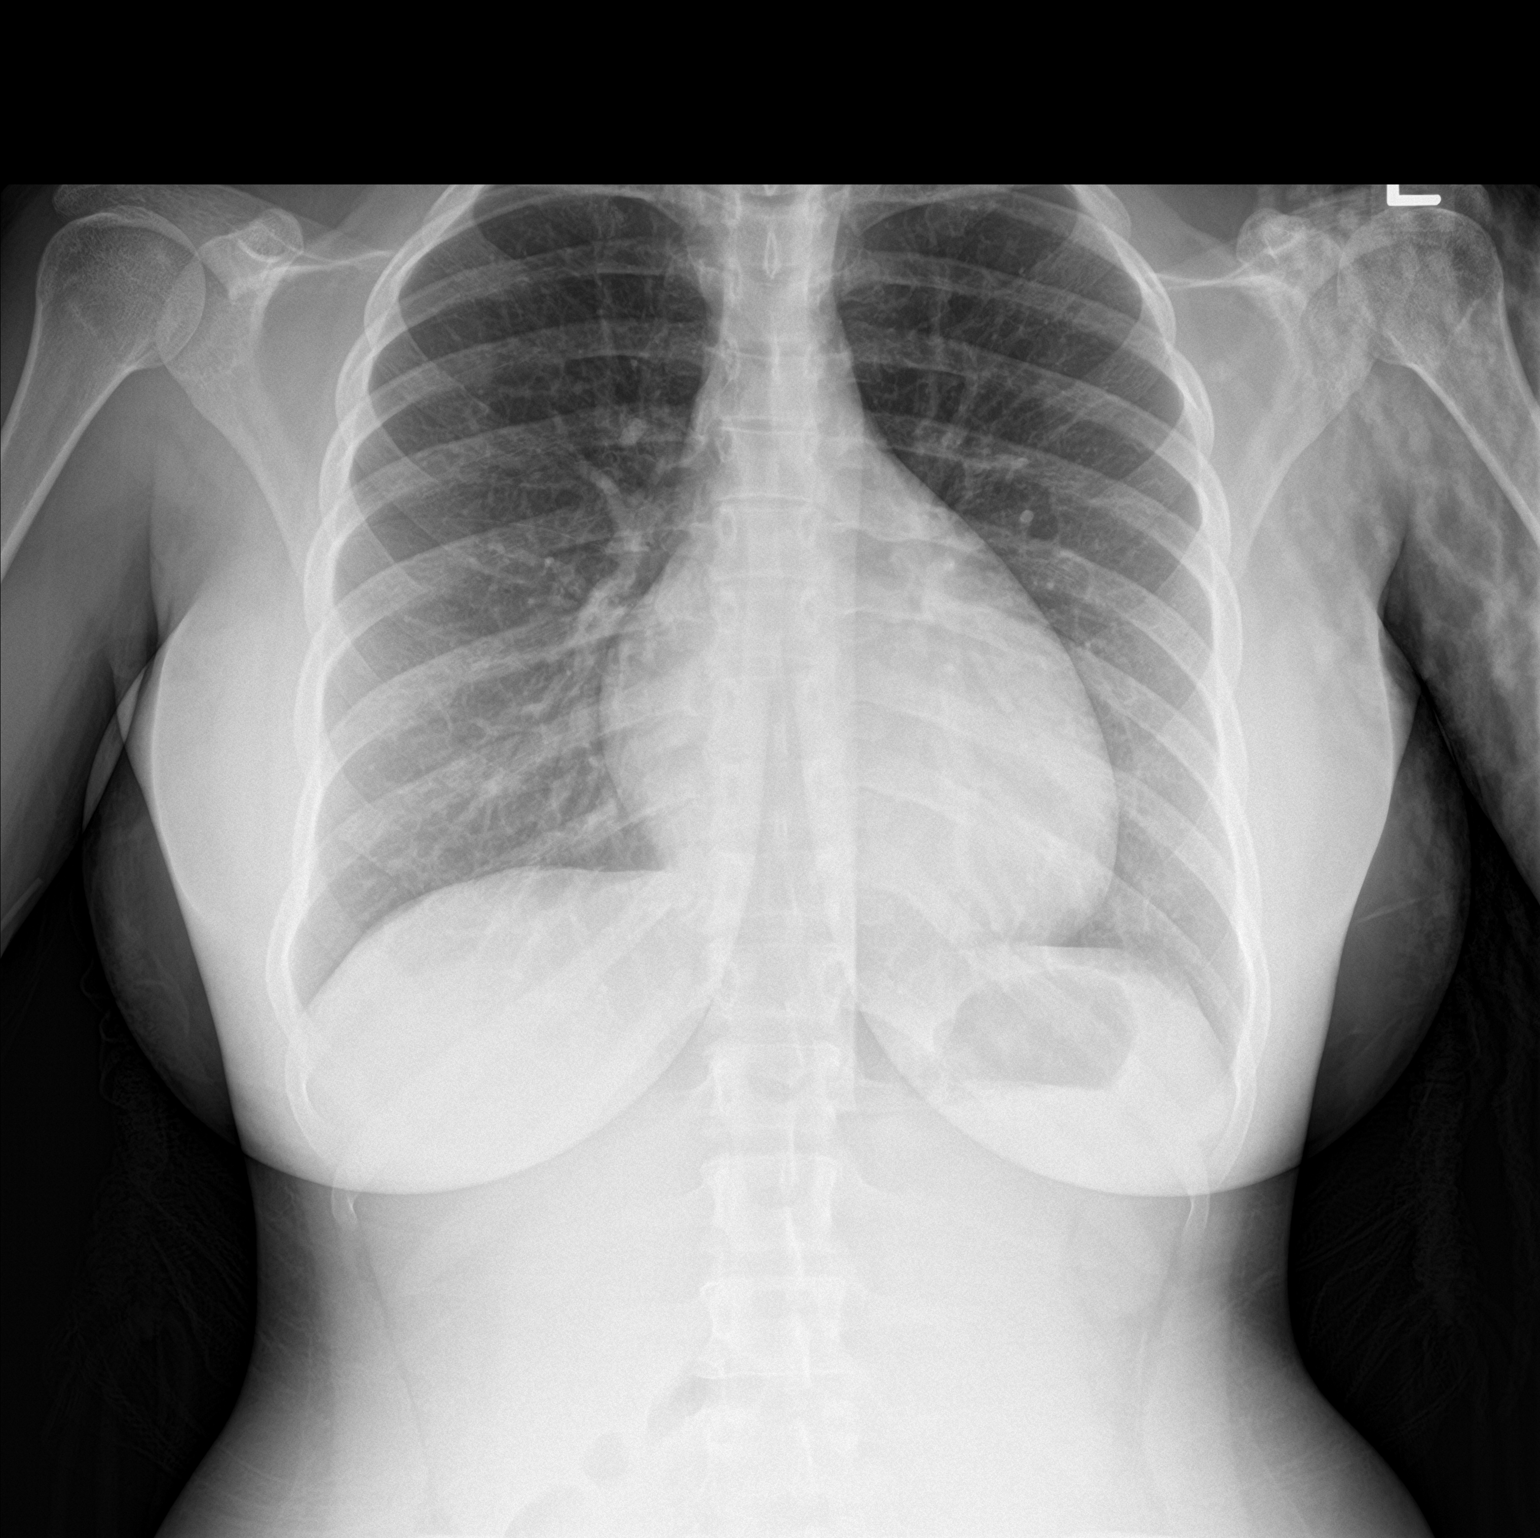

[chest lat]
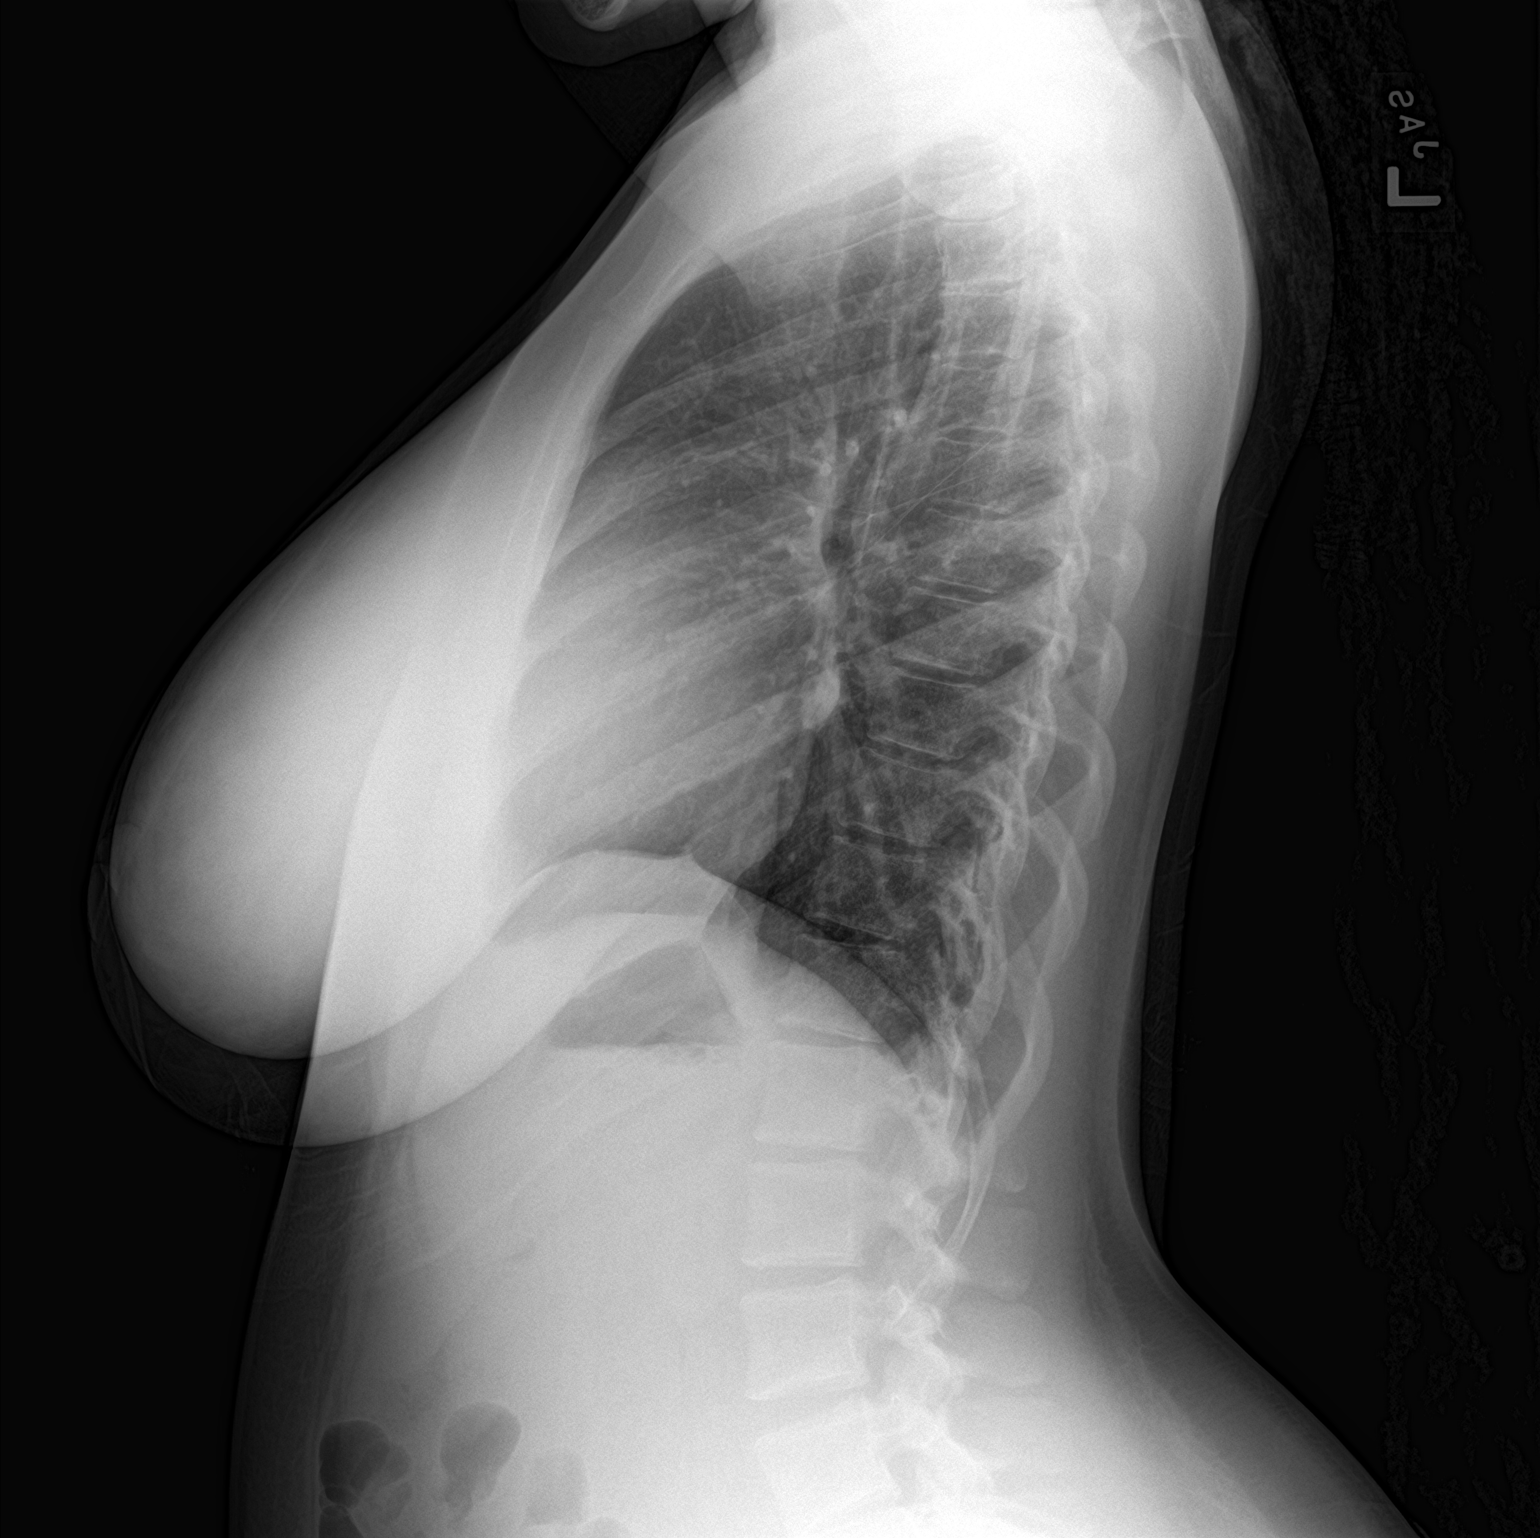

[2 of 2 positions shown; findings below may reference images not displayed]

FINDINGS: The heart size and mediastinal contours are within normal limits. No
focal consolidation. No pleural effusion. No pneumothorax. The
visualized skeletal structures are unremarkable.
IMPRESSION: No active cardiopulmonary disease.

## 2022-12-16 DIAGNOSIS — Z419 Encounter for procedure for purposes other than remedying health state, unspecified: Secondary | ICD-10-CM | POA: Diagnosis not present

## 2023-01-15 DIAGNOSIS — Z419 Encounter for procedure for purposes other than remedying health state, unspecified: Secondary | ICD-10-CM | POA: Diagnosis not present

## 2023-01-28 DIAGNOSIS — Z3046 Encounter for surveillance of implantable subdermal contraceptive: Secondary | ICD-10-CM | POA: Diagnosis not present

## 2023-01-28 DIAGNOSIS — Z113 Encounter for screening for infections with a predominantly sexual mode of transmission: Secondary | ICD-10-CM | POA: Diagnosis not present

## 2023-02-15 DIAGNOSIS — Z419 Encounter for procedure for purposes other than remedying health state, unspecified: Secondary | ICD-10-CM | POA: Diagnosis not present

## 2023-03-17 DIAGNOSIS — Z419 Encounter for procedure for purposes other than remedying health state, unspecified: Secondary | ICD-10-CM | POA: Diagnosis not present

## 2023-03-29 ENCOUNTER — Emergency Department (HOSPITAL_COMMUNITY)
Admission: EM | Admit: 2023-03-29 | Discharge: 2023-03-29 | Disposition: A | Discharge status: Home / Self Care | Payer: BLUE CROSS/BLUE SHIELD | Attending: Emergency Medicine | Admitting: Emergency Medicine

## 2023-03-29 ENCOUNTER — Encounter (HOSPITAL_COMMUNITY): Payer: Self-pay

## 2023-03-29 ENCOUNTER — Emergency Department (HOSPITAL_COMMUNITY): Payer: BLUE CROSS/BLUE SHIELD

## 2023-03-29 DIAGNOSIS — Z3A00 Weeks of gestation of pregnancy not specified: Secondary | ICD-10-CM | POA: Insufficient documentation

## 2023-03-29 DIAGNOSIS — O209 Hemorrhage in early pregnancy, unspecified: Secondary | ICD-10-CM | POA: Diagnosis present

## 2023-03-29 DIAGNOSIS — O4690 Antepartum hemorrhage, unspecified, unspecified trimester: Secondary | ICD-10-CM

## 2023-03-29 LAB — CBC WITH DIFFERENTIAL/PLATELET
Abs Immature Granulocytes: 0.01 10*3/uL (ref 0.00–0.07)
Basophils Absolute: 0.1 10*3/uL (ref 0.0–0.1)
Basophils Relative: 1 %
Eosinophils Absolute: 0.1 10*3/uL (ref 0.0–0.5)
Eosinophils Relative: 2 %
HCT: 38 % (ref 36.0–46.0)
Hemoglobin: 12.8 g/dL (ref 12.0–15.0)
Immature Granulocytes: 0 %
Lymphocytes Relative: 41 %
Lymphs Abs: 2.8 10*3/uL (ref 0.7–4.0)
MCH: 30.8 pg (ref 26.0–34.0)
MCHC: 33.7 g/dL (ref 30.0–36.0)
MCV: 91.6 fL (ref 80.0–100.0)
Monocytes Absolute: 0.5 10*3/uL (ref 0.1–1.0)
Monocytes Relative: 7 %
Neutro Abs: 3.4 10*3/uL (ref 1.7–7.7)
Neutrophils Relative %: 49 %
Platelets: 310 10*3/uL (ref 150–400)
RBC: 4.15 MIL/uL (ref 3.87–5.11)
RDW: 13 % (ref 11.5–15.5)
WBC: 6.8 10*3/uL (ref 4.0–10.5)
nRBC: 0 % (ref 0.0–0.2)

## 2023-03-29 LAB — HCG, QUANTITATIVE, PREGNANCY: hCG, Beta Chain, Quant, S: 307 m[IU]/mL — ABNORMAL HIGH (ref <5)

## 2023-03-29 NOTE — ED Notes (Signed)
Red top collected.

## 2023-03-29 NOTE — ED Provider Triage Note (Signed)
Emergency Medicine Provider Triage Evaluation Note  Melissa Navarro , a 22 y.o. female  was evaluated in triage.  Pt complains of vaginal bleeding.  Patient LMP 01/20/23.  Patient had positive at home pregnancy test.  She began bleeding today.  She has changed her pad 3 times and reports passing some clots.  Endorses lower abdominal pain.    Review of Systems  Positive: As above Negative: As above  Physical Exam  BP 118/76 (BP Location: Left Arm)   Pulse 79   Temp 98.8 F (37.1 C) (Oral)   Resp 16   LMP 01/20/2023 (Approximate)   SpO2 100%  Gen:   Awake, no distress   Resp:  Normal effort  MSK:   Moves extremities without difficulty  Other:    Medical Decision Making  Medically screening exam initiated at 2:55 PM.  Appropriate orders placed.  Melissa Navarro was informed that the remainder of the evaluation will be completed by another provider, this initial triage assessment does not replace that evaluation, and the importance of remaining in the ED until their evaluation is complete.     Melton Alar R, PA-C 03/29/23 [phone]

## 2023-03-29 NOTE — ED Triage Notes (Signed)
Pt arrived reporting she is preg, took home preg test. States heavy vag bleeding started this morning with some clots. Also has abdominal pain, lower. Does not radiate. Denies any other symptoms.

## 2023-03-29 NOTE — Discharge Instructions (Addendum)
Thank you for allowing Melissa Navarro to be a part of your care today.  You were evaluated in the ED for vaginal bleeding during pregnancy.  Your hCG is low at 307.  This may be due to a very early pregnancy.  You will need to have this value rechecked in 48 hours.    Your Melissa Navarro results are as follows: No IUP identified. Findings consistent with pregnancy of unknown  location, differential of which includes IUP too early to visualize,  occult ectopic pregnancy and recent failed pregnancy. Recommend  trending of HCG with repeat ultrasound as indicated.   Unfortunately, with vaginal bleeding, this may also be a miscarriage.  Please schedule a [facility]-up appointment with [facility].  Return to the ED if you develop sudden worsening of your symptoms or if you have new concerns.

## 2023-03-29 NOTE — ED Provider Notes (Signed)
[place] EMERGENCY DEPARTMENT AT [facility] Provider Note   CSN: [id number] Arrival date & time: 03/29/23  1400     History  Chief Complaint  Patient presents with   Vaginal Bleeding    Melissa Navarro is a 22 y.o. female with past medical history significant for anxiety, depression, ADHD, BPD presents to the ED complaining of vaginal bleeding that began this morning.  She states that she has already had a change her pad 3 times prior to coming to the ED and is passing some clots.  She reports she took a home pregnancy test and it was positive.  Her LMP was 01/20/2023.  She has lower abdominal pain that does not radiate.  Denies urinary symptoms, vaginal discharge, nausea, vomiting, diarrhea, fever.       Home Medications Prior to Admission medications   Medication Sig Start Date End Date Taking? Authorizing Provider  etonogestrel (NEXPLANON) 68 MG IMPL implant 1 each by Subdermal route once.    [provider]  ibuprofen (ADVIL) 600 MG tablet Take 1 tablet (600 mg total) by mouth every 6 (six) hours. Patient not taking: Reported on 08/01/2021 06/30/21   Cresenzo-Dishmon, Scarlette Calico, CNM  Prenatal Vit-Fe Fumarate-FA (MULTIVITAMIN-PRENATAL) 27-0.8 MG TABS tablet Take 1 tablet by mouth daily at 12 noon. Patient not taking: Reported on 08/01/2021    [provider]      Allergies    Patient has no known allergies.    Review of Systems   Review of Systems  Constitutional:  Negative for fever.  Gastrointestinal:  Positive for abdominal pain. Negative for diarrhea, nausea and vomiting.  Genitourinary:  Positive for pelvic pain and vaginal bleeding. Negative for dysuria, frequency, urgency and vaginal discharge.    Physical Exam Updated Vital Signs BP 118/76 (BP Location: Left Arm)   Pulse 79   Temp 98.8 F (37.1 C) (Oral)   Resp 16   LMP 01/20/2023 (Approximate)   SpO2 100%  Physical Exam Vitals and nursing note reviewed.  Constitutional:       General: She is not in acute distress.    Appearance: Normal appearance. She is not ill-appearing or diaphoretic.  Cardiovascular:     Rate and Rhythm: Normal rate and regular rhythm.  Pulmonary:     Effort: Pulmonary effort is normal.  Abdominal:     General: Abdomen is flat.     Palpations: Abdomen is soft.     Tenderness: There is abdominal tenderness in the right lower quadrant, suprapubic area and left lower quadrant.  Genitourinary:    Comments: Deferred due to pregnancy with vaginal bleeding. Skin:    General: Skin is warm and dry.     Capillary Refill: Capillary refill takes less than 2 seconds.  Neurological:     Mental Status: She is alert. Mental status is at baseline.  Psychiatric:        Mood and Affect: Mood normal.        Behavior: Behavior normal.     ED Results / Procedures / Treatments   Labs (all labs ordered are listed, but only abnormal results are displayed) Labs Reviewed  HCG, QUANTITATIVE, PREGNANCY - Abnormal; Notable for the following components:      Result Value   hCG, Beta Chain, Quant, S 307 (*)    All other components within normal limits  CBC WITH DIFFERENTIAL/PLATELET  URINALYSIS, ROUTINE W REFLEX MICROSCOPIC    EKG None  Radiology No results found.  Procedures Procedures    Medications Ordered  in ED Medications - No data to display  ED Course/ Medical Decision Making/ A&P                                 Medical Decision Making Amount and/or Complexity of Data Reviewed Labs: ordered. Radiology: ordered.   This patient presents to the ED with chief complaint(s) of vaginal bleeding, positive at home pregnancy test.  The complaint involves an extensive differential diagnosis and also carries with it a high risk of complications and morbidity.    The differential diagnosis includes spontaneous abortion, threatened miscarriage, menstrual bleeding, subchorionic hemorrhage  The initial plan is to obtain labs, [place]  Initial  Assessment:   Exam significant for overall well-appearing patient who is not in acute distress.  Vital signs are stable and she is not hypotensive.  Abdomen is soft with generalized lower abdominal tenderness.  GU exam was deferred due to patient being pregnant and bleeding.  Independent ECG/labs interpretation:  The following labs were independently interpreted:  CBC leukocytosis or anemia.  hCG is 307 which is concerning given patient's LMP was beginning of October.   Independent visualization and interpretation of imaging: I independently visualized the following imaging with scope of interpretation limited to determining acute life threatening conditions related to emergency care: US OB, which revealed no IUP identified.  Disposition:   Advised patient to follow-up with the [facility] in 48 hours to have hCG levels rechecked.  This will likely have to occur Monday since today is Friday.  Concern that patient may actively be having miscarriage due to low hCG levels and no IUP on ultrasound in combination with abdominal cramping and bleeding.    The patient has been appropriately medically screened and/or stabilized in the ED. I have low suspicion for any other emergent medical condition which would require further screening, evaluation or treatment in the ED or require inpatient management. At time of discharge the patient is hemodynamically stable and in no acute distress. I have discussed work-up results and diagnosis with patient and answered all questions. Patient is agreeable with discharge plan. We discussed strict return precautions for returning to the emergency department and they verbalized understanding.           Final Clinical Impression(s) / ED Diagnoses Final diagnoses:  Vaginal bleeding in pregnancy    Rx / DC Orders ED Discharge Orders     None         Lenard Simmer, PA-C 03/29/23 1943    Arby Barrette, MD 04/05/23 1322

## 2023-04-17 DIAGNOSIS — Z419 Encounter for procedure for purposes other than remedying health state, unspecified: Secondary | ICD-10-CM | POA: Diagnosis not present

## 2023-04-17 NOTE — L&D Delivery Note (Addendum)
 Delivery Note Melissa Navarro is a 23 y.o. G3P1011 at [redacted]w[redacted]d admitted for SOL.   GBS Status:  Positive/-- (11/11 1620)  Labor course: Initial SVE: 10/100 BBOW. Augmentation with: AROM. She then progressed to complete.  ROM: 0h 14m with meconium fluid  Birth: After a brief 2nd stage, she delivered a Live born female  Birth Weight: 6 lb 9.1 oz (2980 g) APGAR: 8, 9  Newborn Delivery   Birth date/time: 03/[birth year]/2024 02:44:00 Delivery type: Vaginal, Spontaneous        Delivered via spontaneous vaginal delivery (Presentation: LOA ). Nuchal cord present: No. Shoulders and body delivered in usual fashion. Infant placed directly on mom's abdomen for bonding/skin-to-skin, baby dried and stimulated. Cord clamped x 2 after 1 minute and cut by FOB.  Cord blood collected. Placenta delivered-Spontaneous with 3 vessels. IM Pitocin  given before the delivery of placenta.  Fundus firm with massage. Placenta inspected and appears to be intact with a 3 VC.  Sponge and instrument count were correct x2.  Intrapartum complications:  None Anesthesia:  none Lacerations:  none EBL (mL):200.00    Mom to postpartum.  Baby to Couplet care / Skin to Skin. Placenta to L&D   Plans to Breastfeed Contraception: Nexplanon  Circumcision: wants inpatient  Note sent to [facility]: [facility] for pp visit.  Delivery Report:  Review the Delivery Report for details.     Signed: Rosina Hamilton, DNP,CNM 03/11/2024, 5:15 AM   Midwife attestation: I was gloved and present for delivery in its entirety and I agree with the above midwife student's note.  Olam Boards, CNM 9:43 AM

## 2023-05-18 DIAGNOSIS — Z419 Encounter for procedure for purposes other than remedying health state, unspecified: Secondary | ICD-10-CM | POA: Diagnosis not present

## 2023-05-29 DIAGNOSIS — Z419 Encounter for procedure for purposes other than remedying health state, unspecified: Secondary | ICD-10-CM | POA: Diagnosis not present

## 2023-06-04 ENCOUNTER — Ambulatory Visit: Payer: Self-pay

## 2023-06-04 NOTE — Telephone Encounter (Signed)
  Chief Complaint: Question about recent US Symptoms: none Frequency:  Pertinent Negatives: Patient denies  Disposition: [] ED /[] Urgent Care (no appt availability in office) / [] Appointment(In office/virtual)/ []  [facility]/ [] Home Care/ [] Refused Recommended Disposition /[] [facility]/ []  Follow-up with PCP Additional Notes: Pt thought that US showed a 14 week pregnancy. [place] test was for "Pregnancy less than 14 weeks".  Discussed with pt and has not further questions. Advised pt tp follow up with Ob/GYN.    Summary: Medical questions   Pt calling in about visit to [facility] hospital on 03/29/2023 for a miscarriage. Pt received bill from hospital and has questions about the medical part of the bill. Annette Stable states that patient was [redacted] weeks pregnant at time. Annette Stable is in reference to ultrasound, pt wanting to know if she was [redacted] weeks pregnant would that show on ultrasound.  Please assist pt further     Reason for Disposition  Health Information question, no triage required and triager able to answer question  Answer Assessment - Initial Assessment Questions 1. REASON FOR CALL or QUESTION: "What is your reason for calling today?" or "How can I best help you?" or "What question do you have that I can help answer?"     US findings. Pt was confused because she could not have been [redacted] weeks pregnant.  Protocols used: Information Only Call - No Triage-A-AH

## 2023-06-15 DIAGNOSIS — Z419 Encounter for procedure for purposes other than remedying health state, unspecified: Secondary | ICD-10-CM | POA: Diagnosis not present

## 2023-06-26 DIAGNOSIS — Z419 Encounter for procedure for purposes other than remedying health state, unspecified: Secondary | ICD-10-CM | POA: Diagnosis not present

## 2023-07-27 DIAGNOSIS — Z419 Encounter for procedure for purposes other than remedying health state, unspecified: Secondary | ICD-10-CM | POA: Diagnosis not present

## 2023-08-22 ENCOUNTER — Other Ambulatory Visit

## 2023-08-22 VITALS — BP 109/72 | HR 79 | Wt 135.0 lb

## 2023-08-22 DIAGNOSIS — Z3201 Encounter for pregnancy test, result positive: Secondary | ICD-10-CM

## 2023-08-22 LAB — POCT URINE PREGNANCY: Preg Test, Ur: POSITIVE — AB

## 2023-08-22 NOTE — Addendum Note (Signed)
 Addended by: Lafonda Piety L on: 08/22/2023 04:09 PM   Modules accepted: Orders

## 2023-08-22 NOTE — Progress Notes (Addendum)
 Melissa Navarro here for a UPT. Pt had a positive upt at home. LMP is 05/31/2023.     UPT in office Positive.    Reviewed medications and informed to start a PNV, if not already. Pt to follow up in 3 weeks for New OB visit.  Kacy Hegna l Annali Lybrand, CMA

## 2023-08-26 ENCOUNTER — Ambulatory Visit

## 2023-08-26 DIAGNOSIS — O208 Other hemorrhage in early pregnancy: Secondary | ICD-10-CM | POA: Diagnosis not present

## 2023-08-26 DIAGNOSIS — Z3A10 10 weeks gestation of pregnancy: Secondary | ICD-10-CM | POA: Diagnosis not present

## 2023-08-26 DIAGNOSIS — Z3689 Encounter for other specified antenatal screening: Secondary | ICD-10-CM | POA: Diagnosis not present

## 2023-08-26 DIAGNOSIS — Z3201 Encounter for pregnancy test, result positive: Secondary | ICD-10-CM

## 2023-08-26 DIAGNOSIS — Z419 Encounter for procedure for purposes other than remedying health state, unspecified: Secondary | ICD-10-CM | POA: Diagnosis not present

## 2023-08-28 ENCOUNTER — Ambulatory Visit: Admitting: Obstetrics and Gynecology

## 2023-08-29 ENCOUNTER — Ambulatory Visit: Payer: Self-pay | Admitting: Obstetrics and Gynecology

## 2023-09-19 ENCOUNTER — Ambulatory Visit: Admitting: Obstetrics and Gynecology

## 2023-09-19 ENCOUNTER — Encounter: Payer: Self-pay | Admitting: Obstetrics and Gynecology

## 2023-09-19 ENCOUNTER — Other Ambulatory Visit (HOSPITAL_COMMUNITY)
Admission: RE | Admit: 2023-09-19 | Discharge: 2023-09-19 | Disposition: A | Discharge status: Home / Self Care | Source: Ambulatory Visit | Attending: Obstetrics and Gynecology | Admitting: Obstetrics and Gynecology

## 2023-09-19 VITALS — BP 99/64 | HR 80 | Ht 62.0 in | Wt 141.0 lb

## 2023-09-19 DIAGNOSIS — Z1331 Encounter for screening for depression: Secondary | ICD-10-CM

## 2023-09-19 DIAGNOSIS — Z3480 Encounter for supervision of other normal pregnancy, unspecified trimester: Secondary | ICD-10-CM | POA: Insufficient documentation

## 2023-09-19 DIAGNOSIS — Z3A13 13 weeks gestation of pregnancy: Secondary | ICD-10-CM

## 2023-09-19 DIAGNOSIS — Z3481 Encounter for supervision of other normal pregnancy, first trimester: Secondary | ICD-10-CM

## 2023-09-19 NOTE — Progress Notes (Signed)
 History:  Melissa Navarro is a 23 y.o. G2P1001 at [redacted]w[redacted]d by LMP, early ultrasound being seen today for her first obstetrical visit.   Patient does intend to breast feed.   Pregnancy history fully reviewed. Obstetrical history is significant for normal vaginal delivery at 39w. She presented in labor at 7 cm. Pregnancy was uncomplicated except sometimes she would pass out due to likely low blood pressure.   Patient reports no complaints.  HISTORY: OB History  Gravida Para Term Preterm AB Living  2 1 1  0 0 1  SAB IAB Ectopic Multiple Live Births  0 0 0 0 1    # Outcome Date GA Lbr Len/2nd Weight Sex Type Anes PTL Lv  2 Current           1 Term 06/29/21 [redacted]w[redacted]d 00:55 / 04:12 6 lb 2.1 oz (2.78 kg) M Vag-Spont None  LIV     Name: Vandergrift,BOY Maile     Apgar1: 8  Apgar5: 9   Past Medical History:  Diagnosis Date   ADHD (attention deficit hyperactivity disorder)    Anxiety    Depression    History reviewed. No pertinent surgical history. Family History  Problem Relation Age of Onset   Varicose Veins Father    Stomach cancer Maternal Aunt    Stomach cancer Maternal Grandmother    Social History   Tobacco Use   Smoking status: Never   Smokeless tobacco: Never  Vaping Use   Vaping status: Never Used  Substance Use Topics   Alcohol use: Not Currently   Drug use: Not Currently   No Known Allergies Current Outpatient Medications on File Prior to Visit  Medication Sig Dispense Refill   Prenatal Vit-Fe Fumarate-FA (MULTIVITAMIN-PRENATAL) 27-0.8 MG TABS tablet Take 1 tablet by mouth daily at 12 noon.     No current facility-administered medications on file prior to visit.    Review of Systems Pertinent items noted in HPI and remainder of comprehensive ROS otherwise negative.  Physical Exam:   Vitals:   09/19/23 1459  BP: 99/64  Pulse: 80  Weight: 141 lb (64 kg)     General: well-developed, well-nourished female in no acute distress  Breasts:  normal appearance, no  masses or tenderness bilaterally  Skin: normal coloration and turgor, no rashes  Neurologic: oriented, normal, negative, normal mood  Extremities: normal strength, tone, and muscle mass, ROM of all joints is normal  HEENT PERRLA, extraocular movement intact and sclera clear, anicteric  Neck supple and no masses  Cardiovascular: regular rate and rhythm  Respiratory:  no respiratory distress, normal breath sounds  Abdomen: soft, non-tender; bowel sounds normal; no masses,  no organomegaly  Pelvic: normal external genitalia, no lesions, normal vaginal mucosa, normal vaginal discharge, normal cervix, pap smear done. 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 Supervision of other normal pregnancy, antepartum (Primary) Initial labs ordered. Pap collected Discussed ACURE4MOMs. Pt given information.  Continue prenatal vitamins. Problem list reviewed and updated. Genetic Screening discussed, NIPS: ordered. Ultrasound discussed; fetal anatomic survey: ordered. Anticipatory guidance about prenatal visits given including labs, ultrasounds, and testing. Discussed usage of Babyscripts and virtual visits  The  nature of [facility]Faculty Practice with multiple MDs and Advanced Practice Providers was explained to patient; also emphasized that residents, students are part of our team. Routine obstetric precautions reviewed. Encouraged to seek out care at office or emergency room [facility] preferred) for urgent and/or emergent concerns. Return in about 4 weeks (around 10/17/2023).    Lacey Pian, MD, FACOG Obstetrician &  Gynecologist, [facility] for [facility], [facility]

## 2023-09-19 NOTE — Addendum Note (Signed)
 Addended by: Evelia Hipp on: 09/19/2023 03:42 PM   Modules accepted: Orders

## 2023-09-19 NOTE — Patient Instructions (Signed)
Our practice his participating in a study that provides no-cost doula care. ACURE4Moms is a study looking at how doula care can reduce birthing disparities for Black and brown birthing people. We like to refer patients as soon as possible, but definitely before 28 weeks so patients can get to know their doula.    A doula is trained to provide support before, during and just after you give birth. While doulas do not provide medical care, they do provide emotional, physical and educational support. Doulas can help reduce your stress and comfort you and your partner. They can help you cope with labor by helping you use breathing techniques, massage, creative labor positioning, essential oils and affirmations.   ACURE4Moms is a research study trying to reduce:   low birthweight babies  emergency department visits & hospitalizations for birthing persons and their babies  depression among birthing people  discrimination in pregnancy-related care ACURE4Moms is trying out 2 programs designed by  people who have given birth. These programs include: 1. Sharing patient data and warning alerts with clinic staff to keep them accountable for their patients' outcomes and providing tools to help them  reduce bias in care. 2. Matching eligible patients with doulas from the  same community as the patients.  If you would like to participate in this study, please visit:   [email or web address]

## 2023-09-20 ENCOUNTER — Telehealth: Payer: Self-pay

## 2023-09-20 ENCOUNTER — Other Ambulatory Visit: Payer: Self-pay

## 2023-09-20 NOTE — Telephone Encounter (Signed)
 RN called [facility] and added on Hemoglobin Alc to initial prenatal labs.   Evelia Hipp, RN

## 2023-09-20 NOTE — Addendum Note (Signed)
 Addended by: Evelia Hipp on: 09/20/2023 09:21 AM   Modules accepted: Orders

## 2023-09-21 LAB — CBC/D/PLT+RPR+RH+ABO+RUBIGG...
Antibody Screen: NEGATIVE
Basophils Absolute: 0 10*3/uL (ref 0.0–0.2)
Basos: 1 %
EOS (ABSOLUTE): 0.1 10*3/uL (ref 0.0–0.4)
Eos: 1 %
HCV Ab: NONREACTIVE
HIV Screen 4th Generation wRfx: NONREACTIVE
Hematocrit: 37.8 % (ref 34.0–46.6)
Hemoglobin: 12.6 g/dL (ref 11.1–15.9)
Hepatitis B Surface Ag: NEGATIVE
Immature Grans (Abs): 0 10*3/uL (ref 0.0–0.1)
Immature Granulocytes: 0 %
Lymphocytes Absolute: 1.9 10*3/uL (ref 0.7–3.1)
Lymphs: 38 %
MCH: 30.6 pg (ref 26.6–33.0)
MCHC: 33.3 g/dL (ref 31.5–35.7)
MCV: 92 fL (ref 79–97)
Monocytes Absolute: 0.3 10*3/uL (ref 0.1–0.9)
Monocytes: 7 %
Neutrophils Absolute: 2.6 10*3/uL (ref 1.4–7.0)
Neutrophils: 53 %
Platelets: 243 10*3/uL (ref 150–450)
RBC: 4.12 x10E6/uL (ref 3.77–5.28)
RDW: 12.3 % (ref 11.7–15.4)
RPR Ser Ql: NONREACTIVE
Rh Factor: POSITIVE
Rubella Antibodies, IGG: 4.98 {index} (ref >0.99)
WBC: 4.9 10*3/uL (ref 3.4–10.8)

## 2023-09-21 LAB — CULTURE, OB URINE

## 2023-09-21 LAB — URINE CULTURE, OB REFLEX

## 2023-09-21 LAB — HCV INTERPRETATION

## 2023-09-23 ENCOUNTER — Ambulatory Visit: Payer: Self-pay | Admitting: Obstetrics and Gynecology

## 2023-09-23 DIAGNOSIS — A5901 Trichomonal vulvovaginitis: Secondary | ICD-10-CM | POA: Insufficient documentation

## 2023-09-23 DIAGNOSIS — Z3480 Encounter for supervision of other normal pregnancy, unspecified trimester: Secondary | ICD-10-CM

## 2023-09-23 LAB — CYTOLOGY - PAP
Chlamydia: NEGATIVE
Comment: NEGATIVE
Comment: NORMAL
Diagnosis: NEGATIVE
Neisseria Gonorrhea: NEGATIVE

## 2023-09-23 LAB — HEMOGLOBIN A1C
Est. average glucose Bld gHb Est-mCnc: 105 mg/dL
Hgb A1c MFr Bld: 5.3 % (ref 4.8–5.6)

## 2023-09-23 LAB — SPECIMEN STATUS REPORT

## 2023-09-23 MED ORDER — METRONIDAZOLE 500 MG PO TABS: ORAL_TABLET | ORAL | 1 refills | Status: DC

## 2023-09-26 ENCOUNTER — Encounter: Admitting: Obstetrics and Gynecology

## 2023-09-26 DIAGNOSIS — Z419 Encounter for procedure for purposes other than remedying health state, unspecified: Secondary | ICD-10-CM | POA: Diagnosis not present

## 2023-09-30 NOTE — Addendum Note (Signed)
 Addended by: Evelia Hipp on: 09/30/2023 02:19 PM   Modules accepted: Orders

## 2023-10-01 LAB — PANORAMA PRENATAL TEST FULL PANEL:PANORAMA TEST PLUS 5 ADDITIONAL MICRODELETIONS: FETAL FRACTION: 12.7

## 2023-10-08 LAB — HORIZON CUSTOM

## 2023-10-16 ENCOUNTER — Telehealth: Payer: Self-pay

## 2023-10-16 NOTE — Telephone Encounter (Signed)
 Pt reported noticed leaking of left breast, started last night. Pt reported it is clear with no odor. Pt reported no other concerns. RN educated and answered questions. Pt to follow up with provider at upcoming appointment.  Silvano LELON Piano, RN

## 2023-10-21 ENCOUNTER — Ambulatory Visit: Admitting: Obstetrics & Gynecology

## 2023-10-21 VITALS — BP 115/70 | HR 96 | Wt 152.0 lb

## 2023-10-21 DIAGNOSIS — Z3480 Encounter for supervision of other normal pregnancy, unspecified trimester: Secondary | ICD-10-CM | POA: Diagnosis not present

## 2023-10-21 DIAGNOSIS — F332 Major depressive disorder, recurrent severe without psychotic features: Secondary | ICD-10-CM

## 2023-10-21 NOTE — Progress Notes (Signed)
   PRENATAL VISIT NOTE  Subjective:  Melissa Navarro is a 23 y.o. G2P1001 at [redacted]w[redacted]d being seen today for ongoing prenatal care.  She is currently monitored for the following issues for this low-risk pregnancy and has MDD (major depressive disorder), recurrent severe, without psychosis (HCC); Attention deficit hyperactivity disorder (ADHD); Borderline personality disorder (HCC); Supervision of other normal pregnancy, antepartum; and Trichomonal vaginitis during pregnancy in first trimester on their problem list.  Patient reports no complaints.  Contractions: Not present. Vag. Bleeding: None.  Movement: Present. Denies leaking of fluid.   The following portions of the patient's history were reviewed and updated as appropriate: allergies, current medications, past family history, past medical history, past social history, past surgical history and problem list.   Objective:    Vitals:   10/21/23 1317  BP: 115/70  Pulse: 96  Weight: 152 lb (68.9 kg)    Fetal Status:      Movement: Present    General: Alert, oriented and cooperative. Patient is in no acute distress.  Skin: Skin is warm and dry. No rash noted.   Cardiovascular: Normal heart rate noted  Respiratory: Normal respiratory effort, no problems with respiration noted  Abdomen: Soft, gravid, appropriate for gestational age.  Pain/Pressure: Absent     Pelvic: Cervical exam deferred        Extremities: Normal range of motion.  Edema: None  Mental Status: Normal mood and affect. Normal behavior. Normal judgment and thought content.   Assessment and Plan:  Pregnancy: G2P1001 at [redacted]w[redacted]d 1. Supervision of other normal pregnancy, antepartum (Primary) AFP today Anatomy US  on 11/11/23  2. MDD (major depressive disorder), recurrent severe, without psychosis (HCC) Stable no issues.   Preterm labor symptoms and general obstetric precautions including but not limited to vaginal bleeding, contractions, leaking of fluid and fetal movement were  reviewed in detail with the patient. Please refer to After Visit Summary for other counseling recommendations.    Future Appointments  Date Time Provider Department Center  11/11/2023  1:00 PM [facility] PROVIDER 1 [facility]  11/11/2023  1:30 PM [facility] US1 [facility]    Burnard Pate, MD

## 2023-10-23 LAB — AFP, SERUM, OPEN SPINA BIFIDA
AFP MoM: 1.06
AFP Value: 49.9 ng/mL
Gest. Age on Collection Date: 18 wk
Maternal Age At EDD: 22.2 a
OSBR Risk 1 IN: 10000
Test Results:: NEGATIVE
Weight: 152 [lb_av]

## 2023-10-25 ENCOUNTER — Ambulatory Visit: Payer: Self-pay | Admitting: Obstetrics & Gynecology

## 2023-10-26 DIAGNOSIS — Z419 Encounter for procedure for purposes other than remedying health state, unspecified: Secondary | ICD-10-CM | POA: Diagnosis not present

## 2023-11-11 ENCOUNTER — Ambulatory Visit (HOSPITAL_BASED_OUTPATIENT_CLINIC_OR_DEPARTMENT_OTHER): Admitting: Maternal & Fetal Medicine

## 2023-11-11 ENCOUNTER — Ambulatory Visit: Attending: Obstetrics and Gynecology

## 2023-11-11 ENCOUNTER — Other Ambulatory Visit: Payer: Self-pay | Admitting: Obstetrics and Gynecology

## 2023-11-11 ENCOUNTER — Encounter: Payer: Self-pay | Admitting: Obstetrics and Gynecology

## 2023-11-11 VITALS — BP 103/54 | HR 78

## 2023-11-11 DIAGNOSIS — Z363 Encounter for antenatal screening for malformations: Secondary | ICD-10-CM | POA: Insufficient documentation

## 2023-11-11 DIAGNOSIS — O99342 Other mental disorders complicating pregnancy, second trimester: Secondary | ICD-10-CM | POA: Insufficient documentation

## 2023-11-11 DIAGNOSIS — Z3482 Encounter for supervision of other normal pregnancy, second trimester: Secondary | ICD-10-CM | POA: Diagnosis not present

## 2023-11-11 DIAGNOSIS — F319 Bipolar disorder, unspecified: Secondary | ICD-10-CM

## 2023-11-11 DIAGNOSIS — Z3A21 21 weeks gestation of pregnancy: Secondary | ICD-10-CM | POA: Diagnosis not present

## 2023-11-11 DIAGNOSIS — Z3689 Encounter for other specified antenatal screening: Secondary | ICD-10-CM | POA: Diagnosis not present

## 2023-11-11 DIAGNOSIS — Z3480 Encounter for supervision of other normal pregnancy, unspecified trimester: Secondary | ICD-10-CM | POA: Insufficient documentation

## 2023-11-11 NOTE — Progress Notes (Signed)
   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 G3P1011 at [redacted]w[redacted]d here for ultrasound and consultation. [facility] Melissa Navarro is doing well today with no acute concerns. She has no concerns today. The fetal anatomy appears normal.   Sonographic findings Single intrauterine pregnancy at 21w 2d. Fetal cardiac activity:  Observed and appears normal. Presentation: Cephalic. The anatomic structures that were well seen appear normal without evidence of soft markers. The anatomic survey is complete.  Fetal biometry shows the estimated fetal weight at the 16 percentile. Amniotic fluid: Within normal limits.  MVP: 5.85 cm. Placenta: Posterior. Adnexa: No abnormality visualized. Cervical length: 4.1 cm.  There are limitations of prenatal ultrasound such as the inability to detect certain abnormalities due to poor visualization. Various factors such as fetal position, gestational age and maternal body habitus may increase the difficulty in visualizing the fetal anatomy.    Recommendations -EDD should be 03/21/2024 based on  Early Ultrasound  (08/26/23). -No further ultrasounds are recommended at this time based on the current indications. If future indications arise (e.g. size/date discrepancy on fundal height, gestational diabetes or hypertension) and an ultrasound is to be desired at our MFM office, please send a referral.   Review of  Systems: A review of systems was performed and was negative except per HPI   Vitals and Physical Exam    11/11/2023   12:58 PM 10/21/2023    1:17 PM 09/19/2023    2:59 PM  Vitals with BMI  Height   5' 2  Weight  152 lbs 141 lbs  BMI   25.78  Systolic 103 115 99  Diastolic 54 70 64  Pulse 78 96 80  Sitting comfortably on the sonogram table Nonlabored breathing Normal rate and rhythm Abdomen is nontender  Past pregnancies OB History  Gravida Para Term Preterm AB Living  3 1 1  1 1   SAB IAB Ectopic Multiple Live Births  1   0 1    # Outcome Date GA Lbr Len/2nd Weight Sex Type Anes PTL Lv  3 Current           2 Term 06/29/21 [redacted]w[redacted]d 00:55 / 04:12 6 lb 2.1 oz (2.78 kg) Melissa Vag-Spont None  LIV  1 SAB              I spent 15 minutes reviewing the patients chart, including labs and images as well as counseling the patient about her medical conditions. Greater than 50% of the time was spent in direct face-to-face patient counseling.  Delora Smaller  MFM, [facility]   11/11/2023  3:16 PM

## 2023-11-18 ENCOUNTER — Ambulatory Visit (INDEPENDENT_AMBULATORY_CARE_PROVIDER_SITE_OTHER): Admitting: Obstetrics & Gynecology

## 2023-11-18 VITALS — BP 109/72 | HR 72 | Wt 161.0 lb

## 2023-11-18 DIAGNOSIS — F332 Major depressive disorder, recurrent severe without psychotic features: Secondary | ICD-10-CM

## 2023-11-18 DIAGNOSIS — Z3480 Encounter for supervision of other normal pregnancy, unspecified trimester: Secondary | ICD-10-CM | POA: Diagnosis not present

## 2023-11-18 NOTE — Progress Notes (Signed)
   PRENATAL VISIT NOTE  Subjective:  Melissa Navarro is a 23 y.o. G3P1011 at [redacted]w[redacted]d being seen today for ongoing prenatal care.  She is currently monitored for the following issues for this low-risk pregnancy and has MDD (major depressive disorder), recurrent severe, without psychosis (HCC); Attention deficit hyperactivity disorder (ADHD); Borderline personality disorder (HCC); Supervision of other normal pregnancy, antepartum; and Trichomonal vaginitis during pregnancy in first trimester on their problem list.  Patient reports no complaints.  Contractions: Not present. Vag. Bleeding: None.  Movement: Present. Denies leaking of fluid.   The following portions of the patient's history were reviewed and updated as appropriate: allergies, current medications, past family history, past medical history, past social history, past surgical history and problem list.   Objective:    Vitals:   11/18/23 1303  BP: 109/72  Pulse: 72  Weight: 161 lb (73 kg)    Fetal Status:  Fetal Heart Rate (bpm): 143   Movement: Present    General: Alert, oriented and cooperative. Patient is in no acute distress.  Skin: Skin is warm and dry. No rash noted.   Cardiovascular: Normal heart rate noted  Respiratory: Normal respiratory effort, no problems with respiration noted  Abdomen: Soft, gravid, appropriate for gestational age.  Pain/Pressure: Absent     Pelvic: Cervical exam deferred        Extremities: Normal range of motion.  Edema: Trace  Mental Status: Normal mood and affect. Normal behavior. Normal judgment and thought content.   Assessment and Plan:  Pregnancy: G3P1011 at [redacted]w[redacted]d 1. Supervision of other normal pregnancy, antepartum (Primary) Anatomy US  nml  28 weeks labs next visit   2. MDD (major depressive disorder), recurrent severe, without psychosis (HCC) No issues  3.  TWG = 51 pounds Reviewed good nutrition; walking Pt declined nutrition referral  Preterm labor symptoms and general obstetric  precautions including but not limited to vaginal bleeding, contractions, leaking of fluid and fetal movement were reviewed in detail with the patient. Please refer to After Visit Summary for other counseling recommendations.   RTC 4 weeks  Burnard Pate, MD

## 2023-11-26 DIAGNOSIS — Z419 Encounter for procedure for purposes other than remedying health state, unspecified: Secondary | ICD-10-CM | POA: Diagnosis not present

## 2023-12-09 ENCOUNTER — Telehealth: Payer: Self-pay | Admitting: *Deleted

## 2023-12-09 NOTE — Telephone Encounter (Signed)
 Returned calls from 08/24 - 12/09/2023. Patient advised to call PCP or Urgent Care for COVID testing.

## 2023-12-17 NOTE — Progress Notes (Unsigned)
   PRENATAL VISIT NOTE  Subjective:  Melissa Navarro is a 23 y.o. G3P1011 at [redacted]w[redacted]d being seen today for ongoing prenatal care.  She is currently monitored for the following issues for this {Blank single:19197::high-risk,low-risk} pregnancy and has MDD (major depressive disorder), recurrent severe, without psychosis (HCC); Attention deficit hyperactivity disorder (ADHD); Borderline personality disorder (HCC); Supervision of other normal pregnancy, antepartum; and Trichomonal vaginitis during pregnancy in first trimester on their problem list.  Patient reports {sx:14538}.   .  .   . Denies leaking of fluid.   The following portions of the patient's history were reviewed and updated as appropriate: allergies, current medications, past family history, past medical history, past social history, past surgical history and problem list.   Objective:    There were no vitals filed for this visit.  Fetal Status:           General: Alert, oriented and cooperative. Patient is in no acute distress.  Skin: Skin is warm and dry. No rash noted.   Cardiovascular: Normal heart rate noted  Respiratory: Normal respiratory effort, no problems with respiration noted  Abdomen: Soft, gravid, appropriate for gestational age.        Pelvic: {Blank single:19197::Cervical exam performed in the presence of a chaperone,Cervical exam deferred}        Extremities: Normal range of motion.     Mental Status: Normal mood and affect. Normal behavior. Normal judgment and thought content.   Assessment and Plan:  Pregnancy: G3P1011 at [redacted]w[redacted]d 1. Supervision of other normal pregnancy, antepartum (Primary) ***  2. [redacted] weeks gestation of pregnancy ***  3. MDD (major depressive disorder), recurrent severe, without psychosis (HCC) ***  {Blank single:19197::Term,Preterm} labor symptoms and general obstetric precautions including but not limited to vaginal bleeding, contractions, leaking of fluid and fetal movement  were reviewed in detail with the patient. Please refer to After Visit Summary for other counseling recommendations.   Return in about 2 weeks (around 01/03/2024) for LOB with GTT.  Future Appointments  Date Time Provider Department Center  12/20/2023 10:30 AM [place], Camie LABOR, CNM [facility]    Camie LABOR [place]ote, CNM

## 2023-12-20 ENCOUNTER — Telehealth (INDEPENDENT_AMBULATORY_CARE_PROVIDER_SITE_OTHER): Admitting: Certified Nurse Midwife

## 2023-12-20 DIAGNOSIS — M6289 Other specified disorders of muscle: Secondary | ICD-10-CM

## 2023-12-20 DIAGNOSIS — F332 Major depressive disorder, recurrent severe without psychotic features: Secondary | ICD-10-CM

## 2023-12-20 DIAGNOSIS — Z3A26 26 weeks gestation of pregnancy: Secondary | ICD-10-CM | POA: Diagnosis not present

## 2023-12-20 DIAGNOSIS — O99342 Other mental disorders complicating pregnancy, second trimester: Secondary | ICD-10-CM

## 2023-12-20 DIAGNOSIS — Z3480 Encounter for supervision of other normal pregnancy, unspecified trimester: Secondary | ICD-10-CM

## 2023-12-20 DIAGNOSIS — M25559 Pain in unspecified hip: Secondary | ICD-10-CM | POA: Diagnosis not present

## 2023-12-20 DIAGNOSIS — O26892 Other specified pregnancy related conditions, second trimester: Secondary | ICD-10-CM

## 2023-12-20 MED ORDER — CYCLOBENZAPRINE HCL 10 MG PO TABS: 10.0000 mg | ORAL_TABLET | Freq: Three times a day (TID) | ORAL | 2 refills | Status: DC | PRN

## 2023-12-20 NOTE — Patient Instructions (Addendum)
 You may use magnesium lotion or oil for massage for hip muscle pain. You may do figure 4 stretches, massage (with hand or tennis balls).   PREGNANCY SUPPORT BELT: 75% of pregnant people have some sort of abdominal or back pain at some point in their pregnancy. Your body is adjusting to carrying a growing belly which pulls on the muscles in your back and can destabilize your pelvis and hips. This pain can be eased with daily stretching and movements, but also by wearing a pregnancy support belt. A support belt will help relieve back pain, abdominal, pelvic and leg pain, stabilize the pelvis and relieve pressure on the sciatic nerves. The best way to put it on is when laying down. Make sure it is low enough to wrap it around the underside of your belly and then stand up and attach the top portion. Wear this daily as your belly grows for the best relief of pain.  WHERE TO GET YOUR PREGNANCY BELT:  [facility] [address] [place], [place] [address] [phone]  [facility] [address] [place], [place] [address] [phone]  We highly recommend childbirth education to help you plan for labor and begin practicing coping skills (which will be needed with or without pain meds).  [place] Childbirth Education Options: Sign up by visiting [email or web address]  Childbirth ~ Self-Paced eClass (English and Spanish) This online class offers you the freedom to complete a childbirth education series in the comfort of your own home at your own pace.  Childbirth Class (In-Person 4-Week Series  or on Saturdays, Virtual 4-Week Series ~ [place]) This interactive in-person class series will help you and your partner prepare for your birth experience. Topics include: Labor & Birth, Comfort Measures, Breathing Techniques, Massage, Medical Interventions, Pain Management Options, Cesarean Birth, Postpartum Care, and Newborn Care  Comfort Techniques for Labor ~  In-Person Class [facility]) This interactive class is designed for parents-to-be who want to learn & practice hands-on skills to help relieve some of the discomfort of labor and encourage their babies to rotate toward the best position for birth. Moms and their partners will be able to try a variety of labor positions with birth balls and rebozos as well as practice breathing, relaxation, and visualization techniques.  Natural Childbirth Class (In-Person 5-Week Series, In-Person on Saturdays or Virtual 5-Week Series ~ [place]) This class series is designed for expectant parents who want to learn and practice natural methods of coping with the process of labor and childbirth.  Cesarean Birth Self-Paced eClass (English and Spanish) This online course provides comprehensive information you can trust as you prepare for a possible cesarean birth. In this class, you'll learn how to make your birth and recovery comfortable and joyful through instructive video clips, animations, and activities.  Waterbirth ~ Airline pilot Interested in a waterbirth? In addition to a consultation with your credentialed waterbirth provider, this free, informational online class will help you discover whether waterbirth is the right fit for you. Not all obstetrical practices offer waterbirth, so check with your healthcare provider.  Tour Probation officer) - [facility] [employer] our 4 minute video tour of [facility] located in [place].   [place] Parenting Education Options:  Pregnancy 101 (Virtual) Congratulations on your pregnancy! This class is geared toward moms in their first trimester, but everyone is welcome. We are excited to guide you through all aspects of supporting a healthy pregnancy. You will learn what to expect  at routine prenatal care appointments, common postpartum adjustments, basic infant safety, and breastfeeding.  Successful Partnering &  Parenting ~ In-Person Workshop [facility]) This workshop inspires and equips partners of all economic levels, ages, and cultures to confidently care for their infants, support the birthing persons, and navigate their own transformations into new partners and parents. Learning activities are geared towards supporting partner, but moms are welcome to attend.  'Baby & Me' Parenting Group (Virtual on Wednesdays at 11am) Enjoy this time discussing newborn & infant parenting topics and family adjustment issues with other new parents in a relaxed environment. Each week brings a new speaker or baby-centered activity. This group offers support and connection to parents as they journey through the adjustments and struggles of that sometimes overwhelming first year after the birth of a child.  Baby Safety, CPR, & Choking Class ~ Virtual This life-saving information is meant to encourage parents as they learn important safety and prevention tips as well as infant CPR and relief of choking.  Breastfeeding Class (In-Person in [place] or [employer]) Families learn what to expect in the first days and weeks of breastfeeding your newborn.   Breastfeeding Self-Paced eClass (English & Spanish) Families learn what to expect in the first days and weeks of breastfeeding your newborn.  Caring for Baby ~ In-Person, Virtual or Self-Paced Class This in-person class is for both expectant and adoptive parents who want to learn and practice the most up-to-date newborn care for their babies. Focus is on birth through the first six weeks of life.  CPR & Choking Relief for Infants & Children ~ In-Person Class [facility]) This in-person course is designed for any parent, expectant parent, or adult who cares for infants or children. Participants learn and demonstrate cardiopulmonary resuscitation and choking relief procedures for both infants and children.  Grandparent Love ~ In-Person Class Grandparents will learn the most  updated infant care and safety recommendations. They will discover ways to support their own children during the transition into the parenting role and receive tips on communicating with the new parents.  [place] Parenting Support Group Options:  Bereavement Grief Support Group (Pregnancy/Infant Loss) - Virtual This is an ongoing experience that meets once a month and is designed to help you honor the past, assist you in discovering tools to strengthen you today, and aid you in developing hope for the future.  Breastfeeding & Pumping Support Group (In-Person on Thursdays at 12pm or Virtual on Tuesdays at 5pm) Join us  in-person each Thursday starting June 1st, 2023 at 12pm! This support group is free for all families looking for breastfeeding and/or pumping support.   Community-Based Childbirth Education Options:  [facility] Department Classes:  Childbirth education classes can help you get ready for a positive parenting experience. You can also meet other expectant parents and get free stuff for your baby. Each class runs for five weeks on the same night and costs $45 for the mother-to-be and her support person. Medicaid covers the cost if you are eligible. Call [phone] to register.  [facility] offers a variety of programs for the [employer] and is another great way to get connected. Please go to [email or web address] for more information.  Childbirth With A Twist! Be informed of your options, get educated on birth, understand what your body is doing, learn how to cope, and have a lot of fun and laughs all while doing it either from the comfort of your couch OR in our cozy office and classroom space near  the [place] airport. If you are taking a virtual class, then class is taught LIVE, so you can ask questions and receive answers in real-time from an experienced doula and childbirth educator.  This virtual childbirth education class will  meet for five instruction times online.  Although we are based in [place], [place], this virtual class is open to anyone in the world. Please visit: [email or web address] for more information.  Books We Love: The Doula Guide to Childbirth by Retia Cook and Vernell Donald The First-Time Parent's Childbirth Handbook by Dr. Corean Glatter, CNM The Birth Partner by [employer]   Considering Waterbirth? Guide for patients at Center for [employer] [facility]) Why consider waterbirth? Gentle birth for babies  Less pain medicine used in labor  May allow for passive descent/less pushing  May reduce perineal tears  More mobility and instinctive maternal position changes  Increased maternal relaxation   Is waterbirth safe? What are the risks of infection, drowning or other complications? Infection:  Very low risk (3.7 % for tub vs 4.8% for bed)  7 in 8000 waterbirths with documented infection  Poorly cleaned equipment most common cause  Slightly lower group B strep transmission rate  Drowning  Maternal:  Very low risk  Related to seizures or fainting  Newborn:  Very low risk. No evidence of increased risk of respiratory problems in multiple large studies  Physiological protection from breathing under water  Avoid underwater birth if there are any fetal complications  Once baby's head is out of the water, keep it out.  Birth complication  Some reports of cord trauma, but risk decreased by bringing baby to surface gradually  No evidence of increased risk of shoulder dystocia. Mothers can usually change positions faster in water than in a bed, possibly aiding the maneuvers to free the shoulder.   There are 2 things you MUST do to have a waterbirth with [facility]: Attend a waterbirth class at [employer] [facility]   3rd Wednesday of every month from 7-9 pm (virtual during COVID) [employer] at [email or web address] or  [email or web address] or by calling [phone] Bring us  the certificate from the class to your prenatal appointment or send via MyChart Meet with a midwife at 36 weeks* to see if you can still plan a waterbirth and to sign the consent.   *We also recommend that you schedule as many of your prenatal visits with a midwife as possible.    Helpful information: You may want to bring a bathing suit top to the hospital to wear during labor but this is optional.  All other supplies are provided by the hospital. Please arrive at the hospital with signs of active labor, and do not wait at home until late in labor. It takes 45 min- 1 hour for fetal monitoring, and check in to your room to take place, plus transport and filling of the waterbirth tub.    Things that would prevent you from having a waterbirth: Premature, <37wks  Previous cesarean birth  Presence of thick meconium-stained fluid  Multiple gestation (Twins, triplets, etc.)  Uncontrolled diabetes or gestational diabetes requiring medication  Hypertension diagnosed in pregnancy or preexisting hypertension (gestational hypertension, preeclampsia, or chronic hypertension) Fetal growth restriction (your baby measures less than 10th percentile on ultrasound) Heavy vaginal bleeding  Non-reassuring fetal heart rate  Active infection (MRSA, etc.). Group B Strep is NOT a contraindication for waterbirth.  If your labor has to be induced and induction method requires continuous monitoring of the  baby's heart rate  Other risks/issues identified by your obstetrical provider   Please remember that birth is unpredictable. Under certain unforeseeable circumstances your provider may advise against giving birth in the tub. These decisions will be made on a case-by-case basis and with the safety of you and your baby as our highest priority.    Updated 07/19/21   The 2 Hour Glucose Tolerance Test  A two-hour glucose tolerance test is a screening for  gestational diabetes during pregnancy. It involves fasting, drinking a glucose (sugar) solution, and having blood drawn multiple times.   How to Prepare: Eat normally in the days before the test.   Eat something with a good bit of protein the night before the test - this can be done up to midnight. Think nuts, meat, cheeses.  Avoid food and drink for at least 8 to 12 hours before the test, except for a few sips of water. The test is usually scheduled in the morning so this means no food or drink (except small sips of water or medicines) after midnight the night before.  Do not chew gum (even sugar-free), have candy, drink coffee or tea.   Tell your healthcare provider about all medications, herbs, vitamins, and supplements you take.   Test procedure:  Have blood drawn to check your blood sugar level after fasting. Drink a liquid that contains 75 grams of glucose. Have blood drawn again every 60 minutes for two hours.  Abnormal results: Abnormal blood values for a two-hour glucose tolerance test include:   Fasting: greater than or equal to 92 mg/dL (5.1 mmol/L) 1 hour: greater than or equal to 180 mg/dL (89.9 mmol/L) 2 hour: greater than or equal to 153 mg/dL (8.5 mmol/L)  When it's performed: Most pregnant patients have a glucose screening test between 24 and 28 weeks of pregnancy. The test is sometimes done early if you have a higher risk for diabetes.

## 2023-12-24 ENCOUNTER — Other Ambulatory Visit: Payer: Self-pay

## 2023-12-24 ENCOUNTER — Ambulatory Visit: Attending: Certified Nurse Midwife | Admitting: Physical Therapy

## 2023-12-24 ENCOUNTER — Encounter: Payer: Self-pay | Admitting: Physical Therapy

## 2023-12-24 DIAGNOSIS — R293 Abnormal posture: Secondary | ICD-10-CM | POA: Insufficient documentation

## 2023-12-24 DIAGNOSIS — M6281 Muscle weakness (generalized): Secondary | ICD-10-CM | POA: Insufficient documentation

## 2023-12-24 DIAGNOSIS — R279 Unspecified lack of coordination: Secondary | ICD-10-CM | POA: Insufficient documentation

## 2023-12-24 DIAGNOSIS — M6289 Other specified disorders of muscle: Secondary | ICD-10-CM | POA: Diagnosis not present

## 2023-12-24 NOTE — Therapy (Signed)
 OUTPATIENT PHYSICAL THERAPY FEMALE PELVIC EVALUATION   Patient Name: Melissa Navarro MRN: [id number] DOB:02-27-2001, 23 y.o., female Today's Date: 12/24/2023  END OF SESSION:  PT End of Session - 12/24/23 1334     Visit Number 1    Number of Visits 12    Date for PT Re-Evaluation 03/17/24    Authorization Type Medicaid [employer]    PT Start Time 1230    PT Stop Time 1315    PT Time Calculation (min) 45 min    Activity Tolerance Patient tolerated treatment well    Behavior During Therapy WFL for tasks assessed/performed          Past Medical History:  Diagnosis Date   ADHD (attention deficit hyperactivity disorder)    Anxiety    Depression    History reviewed. No pertinent surgical history. Patient Active Problem List   Diagnosis Date Noted   Trichomonal vaginitis during pregnancy in first trimester 09/23/2023   Supervision of other normal pregnancy, antepartum 09/19/2023   Borderline personality disorder (HCC) 05/20/2020   Attention deficit hyperactivity disorder (ADHD) 02/22/2016   MDD (major depressive disorder), recurrent severe, without psychosis (HCC) 02/21/2016    PCP: none per chart  REFERRING PROVIDER: Regino Camie LABOR, CNM  REFERRING DIAG: M62.89 (ICD-10-CM) - Pelvic floor instability  THERAPY DIAG:  Muscle weakness (generalized)  Unspecified lack of coordination  Abnormal posture  Rationale for Evaluation and Treatment: Rehabilitation  ONSET DATE: 2025  SUBJECTIVE:                                                                                                                                                                                           SUBJECTIVE STATEMENT: [name] - ki-ah  Patient reports to PFPT [redacted] weeks pregnant with her second child with pelvic pain that started last week and is starting to feel a little better. She is now able to get up without limping, 0/10 pelvic pain to report. She is feeling some lower abdominal discomfort  (6/10) that started yesterday and it got worse throughout the night.  Fluid intake: tries to drink water throughout the day but struggles with this, some soda here and there   PAIN:  Are you having pain? Yes NPRS scale: 6/10 Pain location: Internal, Deep, Bilateral, and Anterior  Pain type: aching Pain description: intermittent   Aggravating factors: movement  Relieving factors: unknown, sometimes stretches   PRECAUTIONS: Other: currently pregnant   RED FLAGS: None   WEIGHT BEARING RESTRICTIONS: No  FALLS:  Has patient fallen in last 6 months? No  OCCUPATION: stay at home mom  ACTIVITY LEVEL : moderate considering she is taking  care of her toddler who has autism   PLOF: Independent  PATIENT GOALS: decrease pain during this pregnancy and get stronger   PERTINENT HISTORY:  Hx: ADHD, anxiety, depression  BOWEL MOVEMENT: Pain with bowel movement: Yes Type of bowel movement:Type (Bristol Stool Scale) 4, Frequency within normal limits , Strain no, and Splinting no Fully empty rectum: Yes:   Leakage: No Pads: No Fiber supplement/laxative No  URINATION: Pain with urination: No Fully empty bladder: Yes:   Stream: Strong Urgency: Yes  Frequency: within normal limits  Leakage: none Pads: No  INTERCOURSE: currently sexually active   Ability to have vaginal penetration Yes  Pain with intercourse: none DrynessNo Climax: yes Marinoff Scale: 0/3  PREGNANCY: Vaginal deliveries 1 Tearing No Episiotomy No C-section deliveries 0 Currently pregnant Yes: 27 weeks   PROLAPSE: None  OBJECTIVE:  Note: Objective measures were completed at Evaluation unless otherwise noted.  PATIENT SURVEYS:  PFIQ-7: 45  COGNITION: Overall cognitive status: Within functional limits for tasks assessed     SENSATION: Light touch: Appears intact  LUMBAR SPECIAL TESTS:  Single leg stance test: Positive  FUNCTIONAL TESTS:  Squat: bilateral dynamic knee valgus with loading, general  lumbopelvic stiffness present with transferring   GAIT: Assistive device utilized: None Comments: mild trendelenburg gait pattern with ambulation   POSTURE: rounded shoulders, forward head, increased lumbar lordosis, and flexed trunk    LUMBARAROM/PROM: within normal limits for all motions bilaterally with no pain   LOWER EXTREMITY ROM:  within normal limits for all motions bilaterally with no pain  LOWER EXTREMITY MMT: 4/5 bilateral knees and hips grossly   PALPATION:   General: no tenderness to palpation of bilateral adductors or hip flexors in supine   Pelvic Alignment: anterior pelvic tilt   Abdominal: upper chest breathing, abdominal bracing at rest, decreased lower rib excursion with  inhalation                 External Perineal Exam: mild dryness present with sufficient clitoral hood mobility                              Internal Pelvic Floor: Patient demonstrates weakness in bilateral aspects of superficial and deep pelvic floor musculature. She had no pain or palpable trigger points during today's exam. She demonstrates a lack of coordination in the pelvic floor that is limiting her active range of motion.  Patient confirms identification and approves PT to assess internal pelvic floor and treatment Yes  PELVIC MMT:   MMT eval  Vaginal 3/5, 10 quick flicks, 10 sec hold  Internal Anal Sphincter   External Anal Sphincter   Puborectalis   Diastasis Recti   (Blank rows = not tested)  TONE: Low in bilateral aspects of superficial and deep pelvic floor musculature   PROLAPSE: None present in hooklying position  TODAY'S TREATMENT:  DATE:   EVAL 12/24/23: Examination completed, findings reviewed, pt educated on POC. Pt motivated to participate in PT and agreeable to attempt recommendations.   For all possible CPT codes, reference the Planned  Interventions line above.     Check all conditions that are expected to impact treatment: {Conditions expected to impact treatment:Current pregnancy or recent postpartum  If treatment provided at initial evaluation, no treatment charged due to lack of authorization.   PATIENT EDUCATION:  Education details: relative anatomy and the connection between the diaphragm and pelvic floor, water intake and bladder irritants  Person educated: Patient Education method: Explanation, Demonstration, Tactile cues, Verbal cues, and Handouts Education comprehension: verbalized understanding, returned demonstration, verbal cues required, tactile cues required, and needs further education  HOME EXERCISE PROGRAM: Access Code: [email or web address] URL: [email or web address] Date: 12/24/2023 Prepared by: Celena Domino  Exercises - Supine Pelvic Floor Contraction  - 1 x daily - 7 x weekly - 2 sets - 10 reps - Supine Butterfly Groin Stretch  - 1 x daily - 7 x weekly - 2 sets - hold - Child's Pose Stretch  - 1 x daily - 7 x weekly - 2 sets - hold  ASSESSMENT:  CLINICAL IMPRESSION: Patient is a 23 y.o. female  who was seen today for physical therapy evaluation and treatment for pelvic floor instability and pregnancy (currently 27 weeks with second child). She reports intermittent pelvic pain that is sometimes in the hips and sometimes in the lower abdominal region. She rates this pain 6-7/10 at rest today. She has no urinary or fecal issues to report. She has no pain during intercourse. Patient demonstrates weakness in bilateral aspects of superficial and deep pelvic floor musculature. She had no pain or palpable trigger points during today's exam. She demonstrates a lack of coordination in the pelvic floor that is limiting her active range of motion. Overall, pt tolerated session well and Pt would benefit from additional PT to further address deficits.    OBJECTIVE IMPAIRMENTS: decreased coordination,  decreased endurance, decreased mobility, decreased ROM, decreased strength, and pain.   ACTIVITY LIMITATIONS: carrying, lifting, bending, sitting, standing, squatting, stairs, transfers, and bed mobility  PARTICIPATION LIMITATIONS: meal prep, cleaning, laundry, and community activity  PERSONAL FACTORS: Past/current experiences and Time since onset of injury/illness/exacerbation are also affecting patient's functional outcome.   REHAB POTENTIAL: Good  CLINICAL DECISION MAKING: Stable/uncomplicated  EVALUATION COMPLEXITY: Low   GOALS: Goals reviewed with patient? Yes  SHORT TERM GOALS: Target date: 01/21/2024  Pt will be independent with HEP.  Baseline: Goal status: INITIAL  2.  Pt will be independent with diaphragmatic breathing and down training activities in order to improve pelvic floor relaxation. Baseline:  Goal status: INITIAL  3.  Pt will be able to correctly perform diaphragmatic breathing and appropriate pressure management in order to prevent worsening vaginal wall laxity and improve pelvic floor A/ROM.  Baseline:  Goal status: INITIAL  LONG TERM GOALS: Target date: 06/22/2024  Pt will be independent with advanced HEP.  Baseline:  Goal status: INITIAL  2.  Pt to demonstrate improved coordination of pelvic floor and breathing mechanics with 10# squat with appropriate synergistic patterns to decrease pain and leakage at least 75% of the time for improved ability to complete a 30 minute workout with strain at pelvic floor and symptoms.   Baseline:  Goal status: INITIAL  3.  Pt will report 75% reduction of pain due to improvements in posture, strength, and muscle length in pelvic floor to improve quality  of life and decrease functional limitations due to pain. Baseline:  Goal status: INITIAL  PLAN:  PT FREQUENCY: 1-2x/week  PT DURATION: 12 weeks  PLANNED INTERVENTIONS: 97110-Therapeutic exercises, 97530- Therapeutic activity, 97112- Neuromuscular re-education,  97535- Self Care, 02859- Manual therapy, Patient/Family education, Taping, Joint mobilization, Spinal mobilization, Scar mobilization, Cryotherapy, and Moist heat  PLAN FOR NEXT SESSION: continued pelvic floor training in seated position, introduce birth prep, taping for belly/sacrum   Celena JAYSON Domino, PT 12/24/2023, 1:34 PM

## 2023-12-27 DIAGNOSIS — Z419 Encounter for procedure for purposes other than remedying health state, unspecified: Secondary | ICD-10-CM | POA: Diagnosis not present

## 2023-12-30 ENCOUNTER — Ambulatory Visit: Admitting: Physical Therapy

## 2023-12-30 DIAGNOSIS — M6281 Muscle weakness (generalized): Secondary | ICD-10-CM | POA: Diagnosis not present

## 2023-12-30 DIAGNOSIS — R293 Abnormal posture: Secondary | ICD-10-CM | POA: Diagnosis not present

## 2023-12-30 DIAGNOSIS — R279 Unspecified lack of coordination: Secondary | ICD-10-CM

## 2023-12-30 DIAGNOSIS — M6289 Other specified disorders of muscle: Secondary | ICD-10-CM | POA: Diagnosis not present

## 2023-12-30 NOTE — Therapy (Signed)
 OUTPATIENT PHYSICAL THERAPY FEMALE PELVIC TREATMENT   Patient Name: Melissa Navarro MRN: [id number] DOB:2001/03/13, 23 y.o., female Today's Date: 12/30/2023  END OF SESSION:  PT End of Session - 12/30/23 1250     Visit Number 2    Number of Visits 12    Date for PT Re-Evaluation 03/17/24    Authorization Type Medicaid [employer]    Authorization Time Period RADMD Approved 10vl 12/24/2023-02/22/2024    Authorization - Visit Number 1    Authorization - Number of Visits 10    PT Start Time 1232    PT Stop Time 1312    PT Time Calculation (min) 40 min    Activity Tolerance Patient tolerated treatment well    Behavior During Therapy WFL for tasks assessed/performed           Past Medical History:  Diagnosis Date   ADHD (attention deficit hyperactivity disorder)    Anxiety    Depression    No past surgical history on file. Patient Active Problem List   Diagnosis Date Noted   Trichomonal vaginitis during pregnancy in first trimester 09/23/2023   Supervision of other normal pregnancy, antepartum 09/19/2023   Borderline personality disorder (HCC) 05/20/2020   Attention deficit hyperactivity disorder (ADHD) 02/22/2016   MDD (major depressive disorder), recurrent severe, without psychosis (HCC) 02/21/2016    PCP: none per chart  REFERRING PROVIDER: Regino Camie LABOR, CNM  REFERRING DIAG: M62.89 (ICD-10-CM) - Pelvic floor instability  THERAPY DIAG:  Muscle weakness (generalized)  Unspecified lack of coordination  Abnormal posture  Rationale for Evaluation and Treatment: Rehabilitation  ONSET DATE: 2025  SUBJECTIVE:                                                                                                                                                                                           SUBJECTIVE STATEMENT: Melissa Navarro - ki-ah  Pelvic pain has been getting better, HEP has been helping.    Fluid intake: tries to drink water throughout the day but struggles with  this, some soda here and there   PAIN:  Are you having pain? Yes NPRS scale: 0/10 Pain location: Internal, Deep, Bilateral, and Anterior  Pain type: aching Pain description: intermittent   Aggravating factors: movement  Relieving factors: unknown, sometimes stretches   PRECAUTIONS: Other: currently pregnant   RED FLAGS: None   WEIGHT BEARING RESTRICTIONS: No  FALLS:  Has patient fallen in last 6 months? No  OCCUPATION: stay at home mom  ACTIVITY LEVEL : moderate considering she is taking care of her toddler who has autism   PLOF: Independent  PATIENT GOALS: decrease pain during this  pregnancy and get stronger   PERTINENT HISTORY:  Hx: ADHD, anxiety, depression  BOWEL MOVEMENT: Pain with bowel movement: Yes Type of bowel movement:Type (Bristol Stool Scale) 4, Frequency within normal limits , Strain no, and Splinting no Fully empty rectum: Yes:   Leakage: No Pads: No Fiber supplement/laxative No  URINATION: Pain with urination: No Fully empty bladder: Yes:   Stream: Strong Urgency: Yes  Frequency: within normal limits  Leakage: none Pads: No  INTERCOURSE: currently sexually active   Ability to have vaginal penetration Yes  Pain with intercourse: none DrynessNo Climax: yes Melissa Navarro Scale: 0/3  PREGNANCY: Vaginal deliveries 1 Tearing No Episiotomy No C-section deliveries 0 Currently pregnant Yes: 27 weeks   PROLAPSE: None  OBJECTIVE:  Note: Objective measures were completed at Evaluation unless otherwise noted.  PATIENT SURVEYS:  PFIQ-7: 45  COGNITION: Overall cognitive status: Within functional limits for tasks assessed     SENSATION: Light touch: Appears intact  LUMBAR SPECIAL TESTS:  Single leg stance test: Positive  FUNCTIONAL TESTS:  Squat: bilateral dynamic knee valgus with loading, general lumbopelvic stiffness present with transferring   GAIT: Assistive device utilized: None Comments: mild trendelenburg gait pattern with  ambulation   POSTURE: rounded shoulders, forward head, increased lumbar lordosis, and flexed trunk    LUMBARAROM/PROM: within normal limits for all motions bilaterally with no pain   LOWER EXTREMITY ROM:  within normal limits for all motions bilaterally with no pain  LOWER EXTREMITY MMT: 4/5 bilateral knees and hips grossly   PALPATION:   General: no tenderness to palpation of bilateral adductors or hip flexors in supine   Pelvic Alignment: anterior pelvic tilt   Abdominal: upper chest breathing, abdominal bracing at rest, decreased lower rib excursion with  inhalation                 External Perineal Exam: mild dryness present with sufficient clitoral hood mobility                              Internal Pelvic Floor: Patient demonstrates weakness in bilateral aspects of superficial and deep pelvic floor musculature. She had no pain or palpable trigger points during today's exam. She demonstrates a lack of coordination in the pelvic floor that is limiting her active range of motion.  Patient confirms identification and approves PT to assess internal pelvic floor and treatment Yes  PELVIC MMT:   MMT eval  Vaginal 3/5, 10 quick flicks, 10 sec hold  Internal Anal Sphincter   External Anal Sphincter   Puborectalis   Diastasis Recti   (Blank rows = not tested)  TONE: Low in bilateral aspects of superficial and deep pelvic floor musculature   PROLAPSE: None present in hooklying position  TODAY'S TREATMENT:  DATE:   EVAL 12/24/23: Examination completed, findings reviewed, pt educated on POC. Pt motivated to participate in PT and agreeable to attempt recommendations.   For all possible CPT codes, reference the Planned Interventions line above.     12/30/23: Sitting on ball: X15 pelvic circles rt/lt X15 ant/post pelvic tilts  Hip shifts x10 each +2x30s  each Karolynn pose with yoga block under one knee 2x1 min each side Cat/cow x10 K-tape - abdomen x5 pieces for support and decreased pain    PATIENT EDUCATION:  Education details: relative anatomy and the connection between the diaphragm and pelvic floor, water intake and bladder irritants  Person educated: Patient Education method: Explanation, Demonstration, Tactile cues, Verbal cues, and Handouts Education comprehension: verbalized understanding, returned demonstration, verbal cues required, tactile cues required, and needs further education  HOME EXERCISE PROGRAM: Access Code: [email or web address] URL: [email or web address] Date: 12/24/2023 Prepared by: Celena Domino  Exercises - Supine Pelvic Floor Contraction  - 1 x daily - 7 x weekly - 2 sets - 10 reps - Supine Butterfly Groin Stretch  - 1 x daily - 7 x weekly - 2 sets - hold - Child's Pose Stretch  - 1 x daily - 7 x weekly - 2 sets - hold  ASSESSMENT:  CLINICAL IMPRESSION: Patient is a 23 y.o. female  who was seen today for physical therapy treatment for pelvic floor instability and pregnancy.  pt tolerated session well, reported no pain post taping and denied allergies to adhesives. Educated on how/when to remove tape if needed and/or decreased effectiveness. Pt does continue to demonstrate decreased mobility in bil hips and mid/low back. Pt would benefit from additional PT to further address deficits.    OBJECTIVE IMPAIRMENTS: decreased coordination, decreased endurance, decreased mobility, decreased ROM, decreased strength, and pain.   ACTIVITY LIMITATIONS: carrying, lifting, bending, sitting, standing, squatting, stairs, transfers, and bed mobility  PARTICIPATION LIMITATIONS: meal prep, cleaning, laundry, and community activity  PERSONAL FACTORS: Past/current experiences and Time since onset of injury/illness/exacerbation are also affecting patient's functional outcome.   REHAB POTENTIAL: Good  CLINICAL  DECISION MAKING: Stable/uncomplicated  EVALUATION COMPLEXITY: Low   GOALS: Goals reviewed with patient? Yes  SHORT TERM GOALS: Target date: 01/21/2024  Pt will be independent with HEP.  Baseline: Goal status: INITIAL  2.  Pt will be independent with diaphragmatic breathing and down training activities in order to improve pelvic floor relaxation. Baseline:  Goal status: INITIAL  3.  Pt will be able to correctly perform diaphragmatic breathing and appropriate pressure management in order to prevent worsening vaginal wall laxity and improve pelvic floor A/ROM.  Baseline:  Goal status: INITIAL  LONG TERM GOALS: Target date: 06/22/2024  Pt will be independent with advanced HEP.  Baseline:  Goal status: INITIAL  2.  Pt to demonstrate improved coordination of pelvic floor and breathing mechanics with 10# squat with appropriate synergistic patterns to decrease pain and leakage at least 75% of the time for improved ability to complete a 30 minute workout with strain at pelvic floor and symptoms.   Baseline:  Goal status: INITIAL  3.  Pt will report 75% reduction of pain due to improvements in posture, strength, and muscle length in pelvic floor to improve quality of life and decrease functional limitations due to pain. Baseline:  Goal status: INITIAL  PLAN:  PT FREQUENCY: 1-2x/week  PT DURATION: 12 weeks  PLANNED INTERVENTIONS: 97110-Therapeutic exercises, 97530- Therapeutic activity, [id number]- Neuromuscular re-education, 97535- Self Care, 02859- Manual therapy, Patient/Family education, Taping, Joint mobilization,  Spinal mobilization, Scar mobilization, Cryotherapy, and Moist heat  PLAN FOR NEXT SESSION: continued pelvic floor training in seated position, introduce birth prep, taping for belly/sacrum  Darryle Navy, PT, DPT 09/15/252:43 PM  [facility] [address], [address] [place], [place] [address] Phone # [phone] Fax [phone]

## 2023-12-31 ENCOUNTER — Ambulatory Visit (INDEPENDENT_AMBULATORY_CARE_PROVIDER_SITE_OTHER): Admitting: Obstetrics and Gynecology

## 2023-12-31 ENCOUNTER — Other Ambulatory Visit (HOSPITAL_COMMUNITY)
Admission: RE | Admit: 2023-12-31 | Discharge: 2023-12-31 | Disposition: A | Discharge status: Home / Self Care | Source: Ambulatory Visit | Attending: Obstetrics and Gynecology | Admitting: Obstetrics and Gynecology

## 2023-12-31 VITALS — BP 107/67 | HR 88 | Wt 174.0 lb

## 2023-12-31 DIAGNOSIS — Z3A28 28 weeks gestation of pregnancy: Secondary | ICD-10-CM

## 2023-12-31 DIAGNOSIS — O26893 Other specified pregnancy related conditions, third trimester: Secondary | ICD-10-CM | POA: Insufficient documentation

## 2023-12-31 DIAGNOSIS — Z23 Encounter for immunization: Secondary | ICD-10-CM | POA: Diagnosis not present

## 2023-12-31 DIAGNOSIS — N898 Other specified noninflammatory disorders of vagina: Secondary | ICD-10-CM | POA: Diagnosis not present

## 2023-12-31 DIAGNOSIS — M6289 Other specified disorders of muscle: Secondary | ICD-10-CM

## 2023-12-31 DIAGNOSIS — M25559 Pain in unspecified hip: Secondary | ICD-10-CM

## 2023-12-31 DIAGNOSIS — Z3480 Encounter for supervision of other normal pregnancy, unspecified trimester: Secondary | ICD-10-CM

## 2023-12-31 DIAGNOSIS — O99343 Other mental disorders complicating pregnancy, third trimester: Secondary | ICD-10-CM | POA: Diagnosis not present

## 2023-12-31 DIAGNOSIS — Z3493 Encounter for supervision of normal pregnancy, unspecified, third trimester: Secondary | ICD-10-CM | POA: Diagnosis not present

## 2023-12-31 DIAGNOSIS — F332 Major depressive disorder, recurrent severe without psychotic features: Secondary | ICD-10-CM

## 2023-12-31 MED ORDER — TERCONAZOLE 0.4 % VA CREA: 1.0000 | TOPICAL_CREAM | Freq: Every day | VAGINAL | 0 refills | Status: DC

## 2023-12-31 NOTE — Progress Notes (Addendum)
   PRENATAL VISIT NOTE  Subjective:  Melissa Navarro is a 23 y.o. G3P1011 at [redacted]w[redacted]d being seen today for ongoing prenatal care.  She is currently monitored for the following issues for this low-risk pregnancy and has MDD (major depressive disorder), recurrent severe, without psychosis (HCC); Attention deficit hyperactivity disorder (ADHD); Borderline personality disorder (HCC); Supervision of other normal pregnancy, antepartum; and Trichomonal vaginitis during pregnancy in first trimester on their problem list.  Patient reports vaginal itching and swelling. Denies unusual vaginal discharge or odor. Also verbalizes ongoing bilateral hip pain for which she is receiving PT. She feels that these symptoms are improving with therapy. Contractions: Regular. Vag. Bleeding: None.  Movement: Present. Denies leaking of fluid.   The following portions of the patient's history were reviewed and updated as appropriate: allergies, current medications, past family history, past medical history, past social history, past surgical history and problem list.   Objective:    Vitals:   12/31/23 0832  BP: 107/67  Pulse: 88  Weight: 78.9 kg    Fetal Status:  Fetal Heart Rate (bpm): 147 Fundal Height: 26 cm Movement: Present    General: Alert, oriented and cooperative. Patient is in no acute distress.  Skin: Skin is warm and dry. No rash noted.   Cardiovascular: Normal heart rate noted  Respiratory: Normal respiratory effort, no problems with respiration noted  Abdomen: Soft, gravid, appropriate for gestational age.  Pain/Pressure: Present (in hip)     Pelvic: Large amount of yeast noted on the vagina and outer vagina.   Extremities: Normal range of motion.  Edema: None  Mental Status: Normal mood and affect. Normal behavior. Normal judgment and thought content.   Assessment and Plan:  Pregnancy: G3P1011 at [redacted]w[redacted]d 1. [redacted] weeks gestation of pregnancy (Primary) - Glucose Tolerance, 2 Hours w/1 Hour - CBC -  RPR - HIV Antibody (routine testing w rflx) - Cervicovaginal ancillary only  2. Need for Tdap vaccination - Tdap vaccine greater than or equal to 7yo IM  3. Vaginal discharge - Cervicovaginal ancillary only  - Terconazole  0.4%  6. MDD (major depressive disorder), recurrent severe, without psychosis (HCC) - Currently stable  7. Pregnancy related hip pain in third trimester, antepartum - Improving. Continue PT.  Preterm labor symptoms and general obstetric precautions including but not limited to vaginal bleeding, contractions, leaking of fluid and fetal movement were reviewed in detail with the patient.  Please refer to After Visit Summary for other counseling recommendations.   No follow-ups on file.  Future Appointments  Date Time Provider Department Center  01/07/2024  8:45 AM Donah Darryle RAMAN, PT [facility] None  01/14/2024  1:10 PM Rasch, Delon FERNS, NP [facility]  01/14/2024  3:30 PM Donah Darryle RAMAN, PT [facility] None  01/28/2024  1:10 PM Rasch, Delon FERNS, NP [facility]  02/04/2024  2:00 PM Gretel Kendall C, PT [facility] None  02/11/2024  1:10 PM Rasch, Delon FERNS, NP [facility]  02/11/2024  2:45 PM Gretel Kendall C, PT [facility] None  02/18/2024 11:45 AM Gretel Kendall C, PT [facility] None  02/25/2024 12:30 PM Donah Darryle RAMAN, PT [facility] None  03/03/2024 12:30 PM Gretel Kendall BROCKS, PT [facility] None  03/10/2024 12:30 PM Gretel Kendall BROCKS, PT [facility] None    Debby CHRISTELLA Borer, RN  Rasch, Delon FERNS, NP 12/31/2023 5:22 PM

## 2024-01-01 LAB — GLUCOSE TOLERANCE, 2 HOURS W/ 1HR
Glucose, 1 hour: 67 mg/dL — ABNORMAL LOW (ref 70–179)
Glucose, 2 hour: 66 mg/dL — ABNORMAL LOW (ref 70–152)
Glucose, Fasting: 74 mg/dL (ref 70–91)

## 2024-01-01 LAB — CBC
Hematocrit: 34.7 % (ref 34.0–46.6)
Hemoglobin: 11.7 g/dL (ref 11.1–15.9)
MCH: 30.9 pg (ref 26.6–33.0)
MCHC: 33.7 g/dL (ref 31.5–35.7)
MCV: 92 fL (ref 79–97)
Platelets: 254 x10E3/uL (ref 150–450)
RBC: 3.79 x10E6/uL (ref 3.77–5.28)
RDW: 12.1 % (ref 11.7–15.4)
WBC: 6.8 x10E3/uL (ref 3.4–10.8)

## 2024-01-01 LAB — CERVICOVAGINAL ANCILLARY ONLY
Bacterial Vaginitis (gardnerella): POSITIVE — AB
Candida Glabrata: NEGATIVE
Candida Vaginitis: POSITIVE — AB
Comment: NEGATIVE
Comment: NEGATIVE
Comment: NEGATIVE

## 2024-01-01 LAB — HIV ANTIBODY (ROUTINE TESTING W REFLEX): HIV Screen 4th Generation wRfx: NONREACTIVE

## 2024-01-01 LAB — RPR: RPR Ser Ql: NONREACTIVE

## 2024-01-07 ENCOUNTER — Ambulatory Visit: Payer: Self-pay | Admitting: Physical Therapy

## 2024-01-07 ENCOUNTER — Telehealth: Payer: Self-pay | Admitting: Physical Therapy

## 2024-01-07 ENCOUNTER — Ambulatory Visit: Payer: Self-pay | Admitting: Obstetrics and Gynecology

## 2024-01-07 MED ORDER — METRONIDAZOLE 500 MG PO TABS: 500.0000 mg | ORAL_TABLET | Freq: Two times a day (BID) | ORAL | 0 refills | Status: DC

## 2024-01-07 NOTE — Telephone Encounter (Signed)
 PT called pt about this morning's appointment at 845. Pt reports she thought she cancelled appointment but had no childcare as husband had to work.   Darryle Navy, PT, DPT 09/23/259:23 AM  [facility] [address], [address] [place], [place] [address] Phone # [phone] Fax [phone]

## 2024-01-14 ENCOUNTER — Ambulatory Visit: Payer: Self-pay | Admitting: Physical Therapy

## 2024-01-14 ENCOUNTER — Ambulatory Visit (INDEPENDENT_AMBULATORY_CARE_PROVIDER_SITE_OTHER): Admitting: Obstetrics and Gynecology

## 2024-01-14 VITALS — BP 107/70 | HR 85 | Wt 179.0 lb

## 2024-01-14 DIAGNOSIS — M6281 Muscle weakness (generalized): Secondary | ICD-10-CM | POA: Diagnosis not present

## 2024-01-14 DIAGNOSIS — R279 Unspecified lack of coordination: Secondary | ICD-10-CM

## 2024-01-14 DIAGNOSIS — R293 Abnormal posture: Secondary | ICD-10-CM | POA: Diagnosis not present

## 2024-01-14 DIAGNOSIS — Z3480 Encounter for supervision of other normal pregnancy, unspecified trimester: Secondary | ICD-10-CM

## 2024-01-14 DIAGNOSIS — O23593 Infection of other part of genital tract in pregnancy, third trimester: Secondary | ICD-10-CM

## 2024-01-14 DIAGNOSIS — M6289 Other specified disorders of muscle: Secondary | ICD-10-CM | POA: Diagnosis not present

## 2024-01-14 DIAGNOSIS — A5901 Trichomonal vulvovaginitis: Secondary | ICD-10-CM

## 2024-01-14 DIAGNOSIS — Z3A30 30 weeks gestation of pregnancy: Secondary | ICD-10-CM | POA: Diagnosis not present

## 2024-01-14 NOTE — Progress Notes (Signed)
   PRENATAL VISIT NOTE  Subjective:  Melissa Navarro is a 23 y.o. G3P1011 at [redacted]w[redacted]d being seen today for ongoing prenatal care.  She is currently monitored for the following issues for this low-risk pregnancy and has MDD (major depressive disorder), recurrent severe, without psychosis (HCC); Attention deficit hyperactivity disorder (ADHD); Borderline personality disorder (HCC); Supervision of other normal pregnancy, antepartum; and Trichomonal vaginitis during pregnancy in first trimester on their problem list.  Patient reports no complaints.  Contractions: Irregular. Vag. Bleeding: None.  Movement: Present. Denies leaking of fluid.   The following portions of the patient's history were reviewed and updated as appropriate: allergies, current medications, past family history, past medical history, past social history, past surgical history and problem list.   Objective:    Vitals:   01/14/24 1309  BP: 107/70  Pulse: 85  Weight: 179 lb (81.2 kg)    Fetal Status:  Fetal Heart Rate (bpm): 134 Fundal Height: 31 cm Movement: Present    General: Alert, oriented and cooperative. Patient is in no acute distress.  Skin: Skin is warm and dry. No rash noted.   Cardiovascular: Normal heart rate noted  Respiratory: Normal respiratory effort, no problems with respiration noted  Abdomen: Soft, gravid, appropriate for gestational age.  Pain/Pressure: Present     Pelvic: Cervical exam deferred        Extremities: Normal range of motion.  Edema: None  Mental Status: Normal mood and affect. Normal behavior. Normal judgment and thought content.   Assessment and Plan:  Pregnancy: G3P1011 at [redacted]w[redacted]d  1. Supervision of other normal pregnancy, antepartum (Primary)  Normal 2 hour GTT CBC without signs of anemia.    2. Trichomonal vaginitis during pregnancy in first trimester   + Trich 6/5 Needs TOC, was treated,  will collect at next visit.   Preterm labor symptoms and general obstetric precautions  including but not limited to vaginal bleeding, contractions, leaking of fluid and fetal movement were reviewed in detail with the patient. Please refer to After Visit Summary for other counseling recommendations.   No follow-ups on file.  Future Appointments  Date Time Provider Department Center  01/14/2024  3:30 PM Donah Darryle RAMAN, [place] [facility] None  01/22/2024  4:15 PM Donah Darryle RAMAN, PT [facility] None  01/28/2024  1:10 PM Khadejah Son, Delon FERNS, NP [facility] [place]  02/04/2024  2:00 PM Gretel Kendall C, PT [facility] None  02/11/2024  1:10 PM Elgie Maziarz, Delon FERNS, NP [facility]  02/11/2024  2:45 PM Gretel Kendall C, PT [facility] None  02/18/2024 11:45 AM Gretel Kendall C, PT [facility] None  02/25/2024 12:30 PM Donah Darryle RAMAN, PT [facility] None  03/03/2024 12:30 PM Gretel Kendall BROCKS, PT [facility] None  03/10/2024 12:30 PM Price, Kendall BROCKS, PT [facility] None    Delon Emms, NP

## 2024-01-14 NOTE — Therapy (Unsigned)
 OUTPATIENT PHYSICAL THERAPY FEMALE PELVIC TREATMENT   Patient Name: Melissa Navarro MRN: [id number] DOB:2000/06/15, 23 y.o., female Today's Date: 01/14/2024  END OF SESSION:  PT End of Session - 01/14/24 1547     Visit Number 3    Number of Visits 12    Date for Recertification  03/17/24    Authorization Type Medicaid [employer]    Authorization Time Period RADMD Approved 10vl 12/24/2023-02/22/2024    Authorization - Visit Number 2    Authorization - Number of Visits 10    PT Start Time 1539    PT Stop Time 1615    PT Time Calculation (min) 36 min    Activity Tolerance Patient tolerated treatment well    Behavior During Therapy WFL for tasks assessed/performed           Past Medical History:  Diagnosis Date   ADHD (attention deficit hyperactivity disorder)    Anxiety    Depression    No past surgical history on file. Patient Active Problem List   Diagnosis Date Noted   Trichomonal vaginitis during pregnancy in first trimester 09/23/2023   Supervision of other normal pregnancy, antepartum 09/19/2023   Borderline personality disorder (HCC) 05/20/2020   Attention deficit hyperactivity disorder (ADHD) 02/22/2016   MDD (major depressive disorder), recurrent severe, without psychosis (HCC) 02/21/2016    PCP: none per chart  REFERRING PROVIDER: Regino Camie LABOR, CNM  REFERRING DIAG: M62.89 (ICD-10-CM) - Pelvic floor instability  THERAPY DIAG:  Muscle weakness (generalized)  Unspecified lack of coordination  Abnormal posture  Rationale for Evaluation and Treatment: Rehabilitation  ONSET DATE: 2025  SUBJECTIVE:                                                                                                                                                                                           SUBJECTIVE STATEMENT: [name] - ki-ah  Tape was so helpful I got a Belly band and this has been helpful. Feels tight around hips and low back. Overall pain has been  good   Fluid intake: tries to drink water throughout the day but struggles with this, some soda here and there   PAIN:  Are you having pain? Yes NPRS scale: 0/10 Pain location: Internal, Deep, Bilateral, and Anterior  Pain type: aching Pain description: intermittent   Aggravating factors: movement  Relieving factors: unknown, sometimes stretches   PRECAUTIONS: Other: currently pregnant   RED FLAGS: None   WEIGHT BEARING RESTRICTIONS: No  FALLS:  Has patient fallen in last 6 months? No  OCCUPATION: stay at home mom  ACTIVITY LEVEL : moderate considering she is taking care of her  toddler who has autism   PLOF: Independent  PATIENT GOALS: decrease pain during this pregnancy and get stronger   PERTINENT HISTORY:  Hx: ADHD, anxiety, depression  BOWEL MOVEMENT: Pain with bowel movement: Yes Type of bowel movement:Type (Bristol Stool Scale) 4, Frequency within normal limits , Strain no, and Splinting no Fully empty rectum: Yes:   Leakage: No Pads: No Fiber supplement/laxative No  URINATION: Pain with urination: No Fully empty bladder: Yes:   Stream: Strong Urgency: Yes  Frequency: within normal limits  Leakage: none Pads: No  INTERCOURSE: currently sexually active   Ability to have vaginal penetration Yes  Pain with intercourse: none DrynessNo Climax: yes Marinoff Scale: 0/3  PREGNANCY: Vaginal deliveries 1 Tearing No Episiotomy No C-section deliveries 0 Currently pregnant Yes: 27 weeks   PROLAPSE: None  OBJECTIVE:  Note: Objective measures were completed at Evaluation unless otherwise noted.  PATIENT SURVEYS:  PFIQ-7: 45  COGNITION: Overall cognitive status: Within functional limits for tasks assessed     SENSATION: Light touch: Appears intact  LUMBAR SPECIAL TESTS:  Single leg stance test: Positive  FUNCTIONAL TESTS:  Squat: bilateral dynamic knee valgus with loading, general lumbopelvic stiffness present with transferring    GAIT: Assistive device utilized: None Comments: mild trendelenburg gait pattern with ambulation   POSTURE: rounded shoulders, forward head, increased lumbar lordosis, and flexed trunk    LUMBARAROM/PROM: within normal limits for all motions bilaterally with no pain   LOWER EXTREMITY ROM:  within normal limits for all motions bilaterally with no pain  LOWER EXTREMITY MMT: 4/5 bilateral knees and hips grossly   PALPATION:   General: no tenderness to palpation of bilateral adductors or hip flexors in supine   Pelvic Alignment: anterior pelvic tilt   Abdominal: upper chest breathing, abdominal bracing at rest, decreased lower rib excursion with  inhalation                 External Perineal Exam: mild dryness present with sufficient clitoral hood mobility                              Internal Pelvic Floor: Patient demonstrates weakness in bilateral aspects of superficial and deep pelvic floor musculature. She had no pain or palpable trigger points during today's exam. She demonstrates a lack of coordination in the pelvic floor that is limiting her active range of motion.  Patient confirms identification and approves PT to assess internal pelvic floor and treatment Yes  PELVIC MMT:   MMT eval  Vaginal 3/5, 10 quick flicks, 10 sec hold  Internal Anal Sphincter   External Anal Sphincter   Puborectalis   Diastasis Recti   (Blank rows = not tested)  TONE: Low in bilateral aspects of superficial and deep pelvic floor musculature   PROLAPSE: None present in hooklying position  TODAY'S TREATMENT:  DATE:   EVAL 12/24/23: Examination completed, findings reviewed, pt educated on POC. Pt motivated to participate in PT and agreeable to attempt recommendations.   For all possible CPT codes, reference the Planned Interventions line above.     12/30/23: Sitting on  ball: X15 pelvic circles rt/lt X15 ant/post pelvic tilts  Hip shifts x10 each +2x30s each Karolynn pose with yoga block under one knee 2x1 min each side Cat/cow x10 K-tape - abdomen x5 pieces for support and decreased pain  01/14/24: Sitting on ball: X15 pelvic circles rt/lt X15 ant/post pelvic tilts  Hip shifts x10 each +2x30s each Cat cow x10 Angle wing stretches x10 Trunk rotations x10 Childs pose with yoga block under one knee 2x30s each side Standing L stretch 2x15s each Needle threaders 2x30s each     PATIENT EDUCATION:  Education details: relative anatomy and the connection between the diaphragm and pelvic floor, water intake and bladder irritants  Person educated: Patient Education method: Explanation, Demonstration, Tactile cues, Verbal cues, and Handouts Education comprehension: verbalized understanding, returned demonstration, verbal cues required, tactile cues required, and needs further education  HOME EXERCISE PROGRAM: Access Code: [email or web address] URL: [email or web address] Date: 01/14/2024 Prepared by: Darryle  Exercises - Supine Pelvic Floor Contraction  - 1 x daily - 7 x weekly - 2 sets - 10 reps - Supine Butterfly Groin Stretch  - 1 x daily - 7 x weekly - 2 sets - hold - Child's Pose Stretch  - 1 x daily - 7 x weekly - 2 sets - hold - Cat Cow  - 1 x daily - 7 x weekly - 1 sets - 10 reps - Sidelying Open Book Thoracic Lumbar Rotation and Extension  - 1 x daily - 7 x weekly - 2 sets - 10 reps - Sidelying Reverse Clamshell  - 1 x daily - 7 x weekly - 2 sets - 10 reps - 2x30s hold  ASSESSMENT:  CLINICAL IMPRESSION: Patient is a 23 y.o. female  who was seen today for physical therapy treatment for pelvic floor instability and pregnancy.  pt tolerated session well, reported no pain post taping and denied allergies to adhesives. Educated on how/when to remove tape if needed and/or decreased effectiveness. Pt does continue to demonstrate decreased  mobility in bil hips and mid/low back. Pt would benefit from additional PT to further address deficits.    OBJECTIVE IMPAIRMENTS: decreased coordination, decreased endurance, decreased mobility, decreased ROM, decreased strength, and pain.   ACTIVITY LIMITATIONS: carrying, lifting, bending, sitting, standing, squatting, stairs, transfers, and bed mobility  PARTICIPATION LIMITATIONS: meal prep, cleaning, laundry, and community activity  PERSONAL FACTORS: Past/current experiences and Time since onset of injury/illness/exacerbation are also affecting patient's functional outcome.   REHAB POTENTIAL: Good  CLINICAL DECISION MAKING: Stable/uncomplicated  EVALUATION COMPLEXITY: Low   GOALS: Goals reviewed with patient? Yes  SHORT TERM GOALS: Target date: 01/21/2024  Pt will be independent with HEP.  Baseline: Goal status: INITIAL  2.  Pt will be independent with diaphragmatic breathing and down training activities in order to improve pelvic floor relaxation. Baseline:  Goal status: INITIAL  3.  Pt will be able to correctly perform diaphragmatic breathing and appropriate pressure management in order to prevent worsening vaginal wall laxity and improve pelvic floor A/ROM.  Baseline:  Goal status: INITIAL  LONG TERM GOALS: Target date: 06/22/2024  Pt will be independent with advanced HEP.  Baseline:  Goal status: INITIAL  2.  Pt to demonstrate improved coordination of pelvic floor and breathing  mechanics with 10# squat with appropriate synergistic patterns to decrease pain and leakage at least 75% of the time for improved ability to complete a 30 minute workout with strain at pelvic floor and symptoms.   Baseline:  Goal status: INITIAL  3.  Pt will report 75% reduction of pain due to improvements in posture, strength, and muscle length in pelvic floor to improve quality of life and decrease functional limitations due to pain. Baseline:  Goal status: INITIAL  PLAN:  PT FREQUENCY:  1-2x/week  PT DURATION: 12 weeks  PLANNED INTERVENTIONS: 97110-Therapeutic exercises, 97530- Therapeutic activity, 97112- Neuromuscular re-education, 97535- Self Care, 02859- Manual therapy, Patient/Family education, Taping, Joint mobilization, Spinal mobilization, Scar mobilization, Cryotherapy, and Moist heat  PLAN FOR NEXT SESSION: continued pelvic floor training in seated position, introduce birth prep, taping for belly/sacrum   Darryle Navy, PT, DPT 09/30/253:49 PM  [facility] [address], [address] [place], [place] [address] Phone # [phone] Fax [phone]

## 2024-01-22 ENCOUNTER — Ambulatory Visit: Payer: Self-pay | Attending: Certified Nurse Midwife | Admitting: Physical Therapy

## 2024-01-22 DIAGNOSIS — R293 Abnormal posture: Secondary | ICD-10-CM | POA: Diagnosis not present

## 2024-01-22 DIAGNOSIS — M6281 Muscle weakness (generalized): Secondary | ICD-10-CM | POA: Diagnosis not present

## 2024-01-22 DIAGNOSIS — R279 Unspecified lack of coordination: Secondary | ICD-10-CM | POA: Diagnosis not present

## 2024-01-22 NOTE — Therapy (Signed)
 OUTPATIENT PHYSICAL THERAPY FEMALE PELVIC TREATMENT   Patient Name: Melissa Navarro MRN: [id number] DOB:2000-11-22, 23 y.o., female Today's Date: 01/22/2024  END OF SESSION:  PT End of Session - 01/22/24 1624     Visit Number 4    Number of Visits 12    Date for Recertification  03/17/24    Authorization Type Medicaid [employer]    Authorization Time Period RADMD Approved 10vl 12/24/2023-02/22/2024    Authorization - Visit Number 3    Authorization - Number of Visits 10    PT Start Time 1616    PT Stop Time 1656    PT Time Calculation (min) 40 min    Activity Tolerance Patient tolerated treatment well    Behavior During Therapy WFL for tasks assessed/performed            Past Medical History:  Diagnosis Date   ADHD (attention deficit hyperactivity disorder)    Anxiety    Depression    No past surgical history on file. Patient Active Problem List   Diagnosis Date Noted   Trichomonal vaginitis during pregnancy in first trimester 09/23/2023   Supervision of other normal pregnancy, antepartum 09/19/2023   Borderline personality disorder (HCC) 05/20/2020   Attention deficit hyperactivity disorder (ADHD) 02/22/2016   MDD (major depressive disorder), recurrent severe, without psychosis (HCC) 02/21/2016    PCP: none per chart  REFERRING PROVIDER: Regino Camie LABOR, CNM  REFERRING DIAG: M62.89 (ICD-10-CM) - Pelvic floor instability  THERAPY DIAG:  Muscle weakness (generalized)  Unspecified lack of coordination  Abnormal posture  Rationale for Evaluation and Treatment: Rehabilitation  ONSET DATE: 2025  SUBJECTIVE:                                                                                                                                                                                           SUBJECTIVE STATEMENT: Melissa Navarro - ki-ah  Started having sharp pain at pubic bone but other than this pain getting better.    Fluid intake: tries to drink water throughout  the day but struggles with this, some soda here and there   PAIN:  Are you having pain? Yes NPRS scale: 0/10 Pain location: Internal, Deep, Bilateral, and Anterior  Pain type: aching Pain description: intermittent   Aggravating factors: movement  Relieving factors: unknown, sometimes stretches   PRECAUTIONS: Other: currently pregnant   RED FLAGS: None   WEIGHT BEARING RESTRICTIONS: No  FALLS:  Has patient fallen in last 6 months? No  OCCUPATION: stay at home mom  ACTIVITY LEVEL : moderate considering she is taking care of her toddler who has autism   PLOF: Independent  PATIENT  GOALS: decrease pain during this pregnancy and get stronger   PERTINENT HISTORY:  Hx: ADHD, anxiety, depression  BOWEL MOVEMENT: Pain with bowel movement: Yes Type of bowel movement:Type (Bristol Stool Scale) 4, Frequency within normal limits , Strain no, and Splinting no Fully empty rectum: Yes:   Leakage: No Pads: No Fiber supplement/laxative No  URINATION: Pain with urination: No Fully empty bladder: Yes:   Stream: Strong Urgency: Yes  Frequency: within normal limits  Leakage: none Pads: No  INTERCOURSE: currently sexually active   Ability to have vaginal penetration Yes  Pain with intercourse: none DrynessNo Climax: yes Marinoff Scale: 0/3  PREGNANCY: Vaginal deliveries 1 Tearing No Episiotomy No C-section deliveries 0 Currently pregnant Yes: 27 weeks   PROLAPSE: None  OBJECTIVE:  Note: Objective measures were completed at Evaluation unless otherwise noted.  PATIENT SURVEYS:  PFIQ-7: 45  COGNITION: Overall cognitive status: Within functional limits for tasks assessed     SENSATION: Light touch: Appears intact  LUMBAR SPECIAL TESTS:  Single leg stance test: Positive  FUNCTIONAL TESTS:  Squat: bilateral dynamic knee valgus with loading, general lumbopelvic stiffness present with transferring   GAIT: Assistive device utilized: None Comments: mild  trendelenburg gait pattern with ambulation   POSTURE: rounded shoulders, forward head, increased lumbar lordosis, and flexed trunk    LUMBARAROM/PROM: within normal limits for all motions bilaterally with no pain   LOWER EXTREMITY ROM:  within normal limits for all motions bilaterally with no pain  LOWER EXTREMITY MMT: 4/5 bilateral knees and hips grossly   PALPATION:   General: no tenderness to palpation of bilateral adductors or hip flexors in supine   Pelvic Alignment: anterior pelvic tilt   Abdominal: upper chest breathing, abdominal bracing at rest, decreased lower rib excursion with  inhalation                 External Perineal Exam: mild dryness present with sufficient clitoral hood mobility                              Internal Pelvic Floor: Patient demonstrates weakness in bilateral aspects of superficial and deep pelvic floor musculature. She had no pain or palpable trigger points during today's exam. She demonstrates a lack of coordination in the pelvic floor that is limiting her active range of motion.  Patient confirms identification and approves PT to assess internal pelvic floor and treatment Yes  PELVIC MMT:   MMT eval  Vaginal 3/5, 10 quick flicks, 10 sec hold  Internal Anal Sphincter   External Anal Sphincter   Puborectalis   Diastasis Recti   (Blank rows = not tested)  TONE: Low in bilateral aspects of superficial and deep pelvic floor musculature   PROLAPSE: None present in hooklying position  TODAY'S TREATMENT:  DATE:     01/14/24: Sitting on ball: X15 pelvic circles rt/lt X15 ant/post pelvic tilts  Hip shifts x10 each +2x30s each Cat cow x10 Angle wing stretches x10 Trunk rotations x10 Karolynn pose with yoga block under one knee 2x30s each side Standing L stretch 2x15s each Needle threaders 2x30s  each   01/22/24: Sitting on ball: X15 pelvic circles rt/lt X15 ant/post pelvic tilts  Hip shifts x10 each +2x30s each Blue band rows 2x10 Blue band bil shoulder horizontal abduction 2x10 2x10 blue band diagonals each way angel wing stretches x15 Educated on transverse abdominis activation with log rolling, car transfers and putting on pants to decreased pain at pubic symphysis and pt reports she will attempt, demonstrated good mechanics with exhale with education.       PATIENT EDUCATION:  Education details: relative anatomy and the connection between the diaphragm and pelvic floor, water intake and bladder irritants  Person educated: Patient Education method: Explanation, Demonstration, Tactile cues, Verbal cues, and Handouts Education comprehension: verbalized understanding, returned demonstration, verbal cues required, tactile cues required, and needs further education  HOME EXERCISE PROGRAM: Access Code: [email or web address] URL: [email or web address] Date: 01/14/2024 Prepared by: Darryle  Exercises - Supine Pelvic Floor Contraction  - 1 x daily - 7 x weekly - 2 sets - 10 reps - Supine Butterfly Groin Stretch  - 1 x daily - 7 x weekly - 2 sets - hold - Child's Pose Stretch  - 1 x daily - 7 x weekly - 2 sets - hold - Cat Cow  - 1 x daily - 7 x weekly - 1 sets - 10 reps - Sidelying Open Book Thoracic Lumbar Rotation and Extension  - 1 x daily - 7 x weekly - 2 sets - 10 reps - Sidelying Reverse Clamshell  - 1 x daily - 7 x weekly - 2 sets - 10 reps - 2x30s hold  ASSESSMENT:  CLINICAL IMPRESSION: Patient is a 23 y.o. female  who was seen today for physical therapy treatment for pelvic floor instability and pregnancy.  Pt does continue to demonstrate decreased mobility in bil hips and mid/low back but reports pain is greatly improving. Pt tolerated well, denied pain throughout session. Pt reports she feels like she is getting better overall. Pt would benefit from  additional PT to further address deficits.    OBJECTIVE IMPAIRMENTS: decreased coordination, decreased endurance, decreased mobility, decreased ROM, decreased strength, and pain.   ACTIVITY LIMITATIONS: carrying, lifting, bending, sitting, standing, squatting, stairs, transfers, and bed mobility  PARTICIPATION LIMITATIONS: meal prep, cleaning, laundry, and community activity  PERSONAL FACTORS: Past/current experiences and Time since onset of injury/illness/exacerbation are also affecting patient's functional outcome.   REHAB POTENTIAL: Good  CLINICAL DECISION MAKING: Stable/uncomplicated  EVALUATION COMPLEXITY: Low   GOALS: Goals reviewed with patient? Yes  SHORT TERM GOALS: Target date: 01/21/2024  Pt will be independent with HEP.  Baseline: Goal status: MET  2.  Pt will be independent with diaphragmatic breathing and down training activities in order to improve pelvic floor relaxation. Baseline:  Goal status: MET  3.  Pt will be able to correctly perform diaphragmatic breathing and appropriate pressure management in order to prevent worsening vaginal wall laxity and improve pelvic floor A/ROM.  Baseline:  Goal status: MET  LONG TERM GOALS: Target date: 06/22/2024  Pt will be independent with advanced HEP.  Baseline:  Goal status: on going  2.  Pt to demonstrate improved coordination of pelvic floor and breathing mechanics with 10# squat  with appropriate synergistic patterns to decrease pain and leakage at least 75% of the time for improved ability to complete a 30 minute workout without strain at pelvic floor and symptoms.   Baseline:  Goal status: on going  3.  Pt will report 75% reduction of pain due to improvements in posture, strength, and muscle length in pelvic floor to improve quality of life and decrease functional limitations due to pain. Baseline:  Goal status: on going  PLAN:  PT FREQUENCY: 1-2x/week  PT DURATION: 12 weeks  PLANNED INTERVENTIONS:  97110-Therapeutic exercises, 97530- Therapeutic activity, 97112- Neuromuscular re-education, 97535- Self Care, 02859- Manual therapy, Patient/Family education, Taping, Joint mobilization, Spinal mobilization, Scar mobilization, Cryotherapy, and Moist heat  PLAN FOR NEXT SESSION: continued pelvic floor training in seated position, introduce birth prep, taping for belly/sacrum   Darryle Navy, PT, DPT 10/08/254:59 PM  [facility] [address], [address] [place], [place] [address] Phone # [phone] Fax [phone]

## 2024-01-26 DIAGNOSIS — Z419 Encounter for procedure for purposes other than remedying health state, unspecified: Secondary | ICD-10-CM | POA: Diagnosis not present

## 2024-01-28 ENCOUNTER — Ambulatory Visit (INDEPENDENT_AMBULATORY_CARE_PROVIDER_SITE_OTHER): Admitting: Obstetrics and Gynecology

## 2024-01-28 ENCOUNTER — Ambulatory Visit: Payer: Self-pay | Admitting: Physical Therapy

## 2024-01-28 ENCOUNTER — Other Ambulatory Visit (HOSPITAL_COMMUNITY)
Admission: RE | Admit: 2024-01-28 | Discharge: 2024-01-28 | Disposition: A | Discharge status: Home / Self Care | Source: Ambulatory Visit | Attending: Obstetrics and Gynecology | Admitting: Obstetrics and Gynecology

## 2024-01-28 VITALS — BP 116/70 | HR 105 | Wt 179.0 lb

## 2024-01-28 DIAGNOSIS — O23591 Infection of other part of genital tract in pregnancy, first trimester: Secondary | ICD-10-CM | POA: Insufficient documentation

## 2024-01-28 DIAGNOSIS — O23593 Infection of other part of genital tract in pregnancy, third trimester: Secondary | ICD-10-CM

## 2024-01-28 DIAGNOSIS — A5901 Trichomonal vulvovaginitis: Secondary | ICD-10-CM | POA: Diagnosis present

## 2024-01-28 DIAGNOSIS — R279 Unspecified lack of coordination: Secondary | ICD-10-CM

## 2024-01-28 DIAGNOSIS — Z3480 Encounter for supervision of other normal pregnancy, unspecified trimester: Secondary | ICD-10-CM

## 2024-01-28 DIAGNOSIS — F332 Major depressive disorder, recurrent severe without psychotic features: Secondary | ICD-10-CM

## 2024-01-28 DIAGNOSIS — O99343 Other mental disorders complicating pregnancy, third trimester: Secondary | ICD-10-CM | POA: Diagnosis not present

## 2024-01-28 DIAGNOSIS — R293 Abnormal posture: Secondary | ICD-10-CM | POA: Diagnosis not present

## 2024-01-28 DIAGNOSIS — M6281 Muscle weakness (generalized): Secondary | ICD-10-CM | POA: Diagnosis not present

## 2024-01-28 DIAGNOSIS — Z3A32 32 weeks gestation of pregnancy: Secondary | ICD-10-CM | POA: Diagnosis not present

## 2024-01-28 NOTE — Progress Notes (Signed)
   PRENATAL VISIT NOTE  Subjective:  Melissa Navarro is a 23 y.o. G3P1011 at [redacted]w[redacted]d being seen today for ongoing prenatal care.  She is currently monitored for the following issues for this low-risk pregnancy and has MDD (major depressive disorder), recurrent severe, without psychosis (HCC); Attention deficit hyperactivity disorder (ADHD); Borderline personality disorder (HCC); Supervision of other normal pregnancy, antepartum; and Trichomonal vaginitis during pregnancy in first trimester on their problem list.  Patient reports no complaints.  Contractions: Not present. Vag. Bleeding: None.  Movement: Present. Denies leaking of fluid.   The following portions of the patient's history were reviewed and updated as appropriate: allergies, current medications, past family history, past medical history, past social history, past surgical history and problem list.   Objective:    Vitals:   01/28/24 1308  BP: 116/70  Pulse: (!) 105  Weight: 179 lb (81.2 kg)    Fetal Status:  Fetal Heart Rate (bpm): 140 Fundal Height: 32 cm Movement: Present    General: Alert, oriented and cooperative. Patient is in no acute distress.  Skin: Skin is warm and dry. No rash noted.   Cardiovascular: Normal heart rate noted  Respiratory: Normal respiratory effort, no problems with respiration noted  Abdomen: Soft, gravid, appropriate for gestational age.  Pain/Pressure: Present     Pelvic: Cervical exam performed in the presence of a chaperone        Extremities: Normal range of motion.  Edema: None  Mental Status: Normal mood and affect. Normal behavior. Normal judgment and thought content.   Assessment and Plan:  Pregnancy: G3P1011 at [redacted]w[redacted]d  1. Trichomonal vaginitis during pregnancy in first trimester ([facility])  - Cervicovaginal ancillary only( [place])  2. Supervision of other normal pregnancy, antepartum  - Requests cervical exam today   3. MDD (major depressive disorder), recurrent severe,  without psychosis (HCC)  Having some increased anxiety but feels this is normal for 3rd trimester with toddler at home. Does not feel like she needs to speak with anyone.   Preterm labor symptoms and general obstetric precautions including but not limited to vaginal bleeding, contractions, leaking of fluid and fetal movement were reviewed in detail with the patient. Please refer to After Visit Summary for other counseling recommendations.   No follow-ups on file.  Future Appointments  Date Time Provider Department Center  01/28/2024  2:45 PM Gretel Kendall C, PT [facility] None  02/04/2024  2:00 PM Price, Kendall C, PT [facility] None  02/11/2024  1:10 PM Nour Rodrigues, Delon FERNS, NP [facility]  02/11/2024  2:45 PM Gretel Kendall C, PT [facility] None  02/18/2024 11:45 AM Gretel Kendall C, PT [facility] None  02/25/2024 12:30 PM Donah Darryle RAMAN, PT [facility] None  03/03/2024 12:30 PM Gretel Kendall BROCKS, PT [facility] None  03/10/2024 12:30 PM Price, Kendall BROCKS, PT [facility] None    Delon Emms, NP

## 2024-01-28 NOTE — Progress Notes (Signed)
 Pelvic pressure comes and goes. Denies concerns today. Started taking OTC Fe.

## 2024-01-28 NOTE — Therapy (Signed)
 OUTPATIENT PHYSICAL THERAPY FEMALE PELVIC TREATMENT   Patient Name: Melissa Navarro MRN: [id number] DOB:2001-01-03, 23 y.o., female Today's Date: 01/28/2024  END OF SESSION:  PT End of Session - 01/28/24 1533     Visit Number 5    Number of Visits 12    Date for Recertification  03/17/24    Authorization Type Medicaid [employer]    Authorization Time Period RADMD Approved 10vl 12/24/2023-02/22/2024    Authorization - Visit Number 5    Authorization - Number of Visits 10    PT Start Time 0245    PT Stop Time 0330    PT Time Calculation (min) 45 min    Activity Tolerance Patient tolerated treatment well    Behavior During Therapy WFL for tasks assessed/performed             Past Medical History:  Diagnosis Date   ADHD (attention deficit hyperactivity disorder)    Anxiety    Depression    No past surgical history on file. Patient Active Problem List   Diagnosis Date Noted   Trichomonal vaginitis during pregnancy in first trimester 09/23/2023   Supervision of other normal pregnancy, antepartum 09/19/2023   Borderline personality disorder (HCC) 05/20/2020   Attention deficit hyperactivity disorder (ADHD) 02/22/2016   MDD (major depressive disorder), recurrent severe, without psychosis (HCC) 02/21/2016    PCP: none per chart  REFERRING PROVIDER: Regino Camie LABOR, CNM  REFERRING DIAG: M62.89 (ICD-10-CM) - Pelvic floor instability  THERAPY DIAG:  Muscle weakness (generalized)  Unspecified lack of coordination  Rationale for Evaluation and Treatment: Rehabilitation  ONSET DATE: 2025  SUBJECTIVE:                                                                                                                                                                                           SUBJECTIVE STATEMENT: [name] - ki-ah  [redacted] weeks pregnant - she is feeling good, baby is moving a lot. She is still feeling sharp pain in the pubic bone. She felt this the most when  transferring out of the car. She has a belly band at home but she doesn't use it much. 32 inches is baby's size. No toileting issues to report. She is due November 23rd-Dec 4th of this year.   Fluid intake: tries to drink water throughout the day but struggles with this, some soda here and there   PAIN:  Are you having pain? Yes NPRS scale: 0/10 Pain location: Internal, Deep, Bilateral, and Anterior  Pain type: aching Pain description: intermittent   Aggravating factors: movement  Relieving factors: unknown, sometimes stretches   PRECAUTIONS: Other: currently pregnant  RED FLAGS: None   WEIGHT BEARING RESTRICTIONS: No  FALLS:  Has patient fallen in last 6 months? No  OCCUPATION: stay at home mom  ACTIVITY LEVEL : moderate considering she is taking care of her toddler who has autism   PLOF: Independent  PATIENT GOALS: decrease pain during this pregnancy and get stronger   PERTINENT HISTORY:  Hx: ADHD, anxiety, depression  BOWEL MOVEMENT: Pain with bowel movement: Yes Type of bowel movement:Type (Bristol Stool Scale) 4, Frequency within normal limits , Strain no, and Splinting no Fully empty rectum: Yes:   Leakage: No Pads: No Fiber supplement/laxative No  URINATION: Pain with urination: No Fully empty bladder: Yes:   Stream: Strong Urgency: Yes  Frequency: within normal limits  Leakage: none Pads: No  INTERCOURSE: currently sexually active   Ability to have vaginal penetration Yes  Pain with intercourse: none DrynessNo Climax: yes Marinoff Scale: 0/3  PREGNANCY: Vaginal deliveries 1 Tearing No Episiotomy No C-section deliveries 0 Currently pregnant Yes: 27 weeks   PROLAPSE: None  OBJECTIVE:  Note: Objective measures were completed at Evaluation unless otherwise noted.  PATIENT SURVEYS:  PFIQ-7: 45  COGNITION: Overall cognitive status: Within functional limits for tasks assessed     SENSATION: Light touch: Appears intact  LUMBAR SPECIAL  TESTS:  Single leg stance test: Positive  FUNCTIONAL TESTS:  Squat: bilateral dynamic knee valgus with loading, general lumbopelvic stiffness present with transferring   GAIT: Assistive device utilized: None Comments: mild trendelenburg gait pattern with ambulation   POSTURE: rounded shoulders, forward head, increased lumbar lordosis, and flexed trunk    LUMBARAROM/PROM: within normal limits for all motions bilaterally with no pain   LOWER EXTREMITY ROM:  within normal limits for all motions bilaterally with no pain  LOWER EXTREMITY MMT: 4/5 bilateral knees and hips grossly   PALPATION:   General: no tenderness to palpation of bilateral adductors or hip flexors in supine   Pelvic Alignment: anterior pelvic tilt   Abdominal: upper chest breathing, abdominal bracing at rest, decreased lower rib excursion with  inhalation                 External Perineal Exam: mild dryness present with sufficient clitoral hood mobility                              Internal Pelvic Floor: Patient demonstrates weakness in bilateral aspects of superficial and deep pelvic floor musculature. She had no pain or palpable trigger points during today's exam. She demonstrates a lack of coordination in the pelvic floor that is limiting her active range of motion.  Patient confirms identification and approves PT to assess internal pelvic floor and treatment Yes  PELVIC MMT:   MMT eval  Vaginal 3/5, 10 quick flicks, 10 sec hold  Internal Anal Sphincter   External Anal Sphincter   Puborectalis   Diastasis Recti   (Blank rows = not tested)  TONE: Low in bilateral aspects of superficial and deep pelvic floor musculature   PROLAPSE: None present in hooklying position  TODAY'S TREATMENT:  DATE:   01/14/24: Sitting on ball: X15 pelvic circles rt/lt X15 ant/post pelvic tilts  Hip  shifts x10 each +2x30s each Cat cow x10 Angle wing stretches x10 Trunk rotations x10 Karolynn pose with yoga block under one knee 2x30s each side Standing L stretch 2x15s each Needle threaders 2x30s each  01/22/24: Sitting on ball: X15 pelvic circles rt/lt X15 ant/post pelvic tilts  Hip shifts x10 each +2x30s each Blue band rows 2x10 Blue band bil shoulder horizontal abduction 2x10 2x10 blue band diagonals each way angel wing stretches x15 Educated on transverse abdominis activation with log rolling, car transfers and putting on pants to decreased pain at pubic symphysis and pt reports she will attempt, demonstrated good mechanics with exhale with education.    01/28/24: NuStep level 2, 6 minutes, PT present to discuss current status  Sitting on ball: X15 pelvic circles rt/lt X15 ant/post pelvic tilts Hip shifts x10 each +2x30s each Blue band rows 2x10 Blue band bil shoulder horizontal abduction 2x10 2x10 blue band diagonals each way angel wing stretches x15 Sidelying open books + diaphragmatic breathing 2x10  Sidelying reverse clamshells + diaphragmatic breathing 2x10  Quadruped rocking into externally rotated hips + diaphragmatic breathing 2x10  Quadruped thread the needle + diaphragmatic breathing 2x5 each side  Seated shoulder rolls forward/backward 2x20    PATIENT EDUCATION:  Education details: relative anatomy and the connection between the diaphragm and pelvic floor, water intake and bladder irritants  Person educated: Patient Education method: Explanation, Demonstration, Tactile cues, Verbal cues, and Handouts Education comprehension: verbalized understanding, returned demonstration, verbal cues required, tactile cues required, and needs further education  HOME EXERCISE PROGRAM: Access Code: [email or web address] URL: [email or web address] Date: 01/14/2024 Prepared by: Darryle  Exercises - Supine Pelvic Floor Contraction  - 1 x daily - 7 x weekly - 2 sets - 10  reps - Supine Butterfly Groin Stretch  - 1 x daily - 7 x weekly - 2 sets - hold - Child's Pose Stretch  - 1 x daily - 7 x weekly - 2 sets - hold - Cat Cow  - 1 x daily - 7 x weekly - 1 sets - 10 reps - Sidelying Open Book Thoracic Lumbar Rotation and Extension  - 1 x daily - 7 x weekly - 2 sets - 10 reps - Sidelying Reverse Clamshell  - 1 x daily - 7 x weekly - 2 sets - 10 reps - 2x30s hold  ASSESSMENT:  CLINICAL IMPRESSION: Patient is a 23 y.o. female  who was seen today for physical therapy treatment for pelvic floor instability and pregnancy.  Pt demonstrated thoracic restriction due to growing baby belly, so we incorporated quadruped thoracic rotation exercises and she felt very tight with this. Pt tolerated well, denied pain throughout session. Pt reports she feels like she is getting better overall. Pt would benefit from additional PT to further address deficits.    OBJECTIVE IMPAIRMENTS: decreased coordination, decreased endurance, decreased mobility, decreased ROM, decreased strength, and pain.   ACTIVITY LIMITATIONS: carrying, lifting, bending, sitting, standing, squatting, stairs, transfers, and bed mobility  PARTICIPATION LIMITATIONS: meal prep, cleaning, laundry, and community activity  PERSONAL FACTORS: Past/current experiences and Time since onset of injury/illness/exacerbation are also affecting patient's functional outcome.   REHAB POTENTIAL: Good  CLINICAL DECISION MAKING: Stable/uncomplicated  EVALUATION COMPLEXITY: Low   GOALS: Goals reviewed with patient? Yes  SHORT TERM GOALS: Target date: 01/21/2024  Pt will be independent with HEP.  Baseline: Goal status: MET  2.  Pt will  be independent with diaphragmatic breathing and down training activities in order to improve pelvic floor relaxation. Baseline:  Goal status: MET  3.  Pt will be able to correctly perform diaphragmatic breathing and appropriate pressure management in order to prevent worsening  vaginal wall laxity and improve pelvic floor A/ROM.  Baseline:  Goal status: MET  LONG TERM GOALS: Target date: 06/22/2024  Pt will be independent with advanced HEP.  Baseline:  Goal status: on going  2.  Pt to demonstrate improved coordination of pelvic floor and breathing mechanics with 10# squat with appropriate synergistic patterns to decrease pain and leakage at least 75% of the time for improved ability to complete a 30 minute workout without strain at pelvic floor and symptoms.   Baseline:  Goal status: on going  3.  Pt will report 75% reduction of pain due to improvements in posture, strength, and muscle length in pelvic floor to improve quality of life and decrease functional limitations due to pain. Baseline:  Goal status: on going  PLAN:  PT FREQUENCY: 1-2x/week  PT DURATION: 12 weeks  PLANNED INTERVENTIONS: 97110-Therapeutic exercises, 97530- Therapeutic activity, 97112- Neuromuscular re-education, 97535- Self Care, [id number]- Manual therapy, Patient/Family education, Taping, Joint mobilization, Spinal mobilization, Scar mobilization, Cryotherapy, and Moist heat  PLAN FOR NEXT SESSION: continued pelvic floor training in seated position, introduce birth prep, taping for belly/sacrum   Celena Domino, PT, DPT 01/28/24 3:34 PM   [facility] [address], [address] [place], [place] [address] Phone # [phone] Fax [phone]

## 2024-01-29 ENCOUNTER — Telehealth: Payer: Self-pay

## 2024-01-29 LAB — CERVICOVAGINAL ANCILLARY ONLY
Comment: NEGATIVE
Trichomonas: NEGATIVE

## 2024-01-30 DIAGNOSIS — Z3483 Encounter for supervision of other normal pregnancy, third trimester: Secondary | ICD-10-CM | POA: Diagnosis not present

## 2024-01-30 DIAGNOSIS — Z3482 Encounter for supervision of other normal pregnancy, second trimester: Secondary | ICD-10-CM | POA: Diagnosis not present

## 2024-02-04 ENCOUNTER — Ambulatory Visit: Admitting: Physical Therapy

## 2024-02-04 DIAGNOSIS — M6281 Muscle weakness (generalized): Secondary | ICD-10-CM

## 2024-02-04 DIAGNOSIS — R293 Abnormal posture: Secondary | ICD-10-CM | POA: Diagnosis not present

## 2024-02-04 DIAGNOSIS — R279 Unspecified lack of coordination: Secondary | ICD-10-CM | POA: Diagnosis not present

## 2024-02-04 NOTE — Therapy (Signed)
 OUTPATIENT PHYSICAL THERAPY FEMALE PELVIC TREATMENT   Patient Name: Melissa Navarro MRN: [id number] DOB:27-May-2000, 23 y.o., female Today's Date: 02/04/2024  END OF SESSION:  PT End of Session - 02/04/24 1430     Visit Number 6    Number of Visits 12    Date for Recertification  03/17/24    Authorization Type [employer]    Authorization Time Period RADMD Approved 10vl 12/24/2023-02/22/2024    Authorization - Visit Number 6    Authorization - Number of Visits 10    PT Start Time 0200    PT Stop Time 0240    PT Time Calculation (min) 40 min    Activity Tolerance Patient tolerated treatment well    Behavior During Therapy WFL for tasks assessed/performed              Past Medical History:  Diagnosis Date   ADHD (attention deficit hyperactivity disorder)    Anxiety    Depression    No past surgical history on file. Patient Active Problem List   Diagnosis Date Noted   Trichomonal vaginitis during pregnancy in first trimester 09/23/2023   Supervision of other normal pregnancy, antepartum 09/19/2023   Borderline personality disorder (HCC) 05/20/2020   Attention deficit hyperactivity disorder (ADHD) 02/22/2016   MDD (major depressive disorder), recurrent severe, without psychosis (HCC) 02/21/2016    PCP: none per chart  REFERRING PROVIDER: Regino Camie LABOR, CNM  REFERRING DIAG: M62.89 (ICD-10-CM) - Pelvic floor instability  THERAPY DIAG:  Muscle weakness (generalized)  Rationale for Evaluation and Treatment: Rehabilitation  ONSET DATE: 2025  SUBJECTIVE:                                                                                                                                                                                           SUBJECTIVE STATEMENT: [name] - ki-ah  [redacted] weeks pregnant - she feels good, baby is moving quite a bit. She has been having some Braxton-Hicks contractions today. The contractions have been starting at the pubic bone and  moving up. She has been trying to walk more. 2/10 pain today at rest in pubic bone.   Fluid intake: tries to drink water throughout the day but struggles with this, some soda here and there   PAIN:  Are you having pain? Yes NPRS scale: 0/10 Pain location: Internal, Deep, Bilateral, and Anterior  Pain type: aching Pain description: intermittent   Aggravating factors: movement  Relieving factors: unknown, sometimes stretches   PRECAUTIONS: Other: currently pregnant   RED FLAGS: None   WEIGHT BEARING RESTRICTIONS: No  FALLS:  Has patient fallen in last 6 months? No  OCCUPATION: stay at home mom  ACTIVITY LEVEL : moderate considering she is taking care of her toddler who has autism   PLOF: Independent  PATIENT GOALS: decrease pain during this pregnancy and get stronger   PERTINENT HISTORY:  Hx: ADHD, anxiety, depression  BOWEL MOVEMENT: Pain with bowel movement: Yes Type of bowel movement:Type (Bristol Stool Scale) 4, Frequency within normal limits , Strain no, and Splinting no Fully empty rectum: Yes:   Leakage: No Pads: No Fiber supplement/laxative No  URINATION: Pain with urination: No Fully empty bladder: Yes:   Stream: Strong Urgency: Yes  Frequency: within normal limits  Leakage: none Pads: No  INTERCOURSE: currently sexually active   Ability to have vaginal penetration Yes  Pain with intercourse: none DrynessNo Climax: yes Marinoff Scale: 0/3  PREGNANCY: Vaginal deliveries 1 Tearing No Episiotomy No C-section deliveries 0 Currently pregnant Yes: 27 weeks   PROLAPSE: None  OBJECTIVE:  Note: Objective measures were completed at Evaluation unless otherwise noted.  PATIENT SURVEYS:  PFIQ-7: 45  COGNITION: Overall cognitive status: Within functional limits for tasks assessed     SENSATION: Light touch: Appears intact  LUMBAR SPECIAL TESTS:  Single leg stance test: Positive  FUNCTIONAL TESTS:  Squat: bilateral dynamic knee valgus with  loading, general lumbopelvic stiffness present with transferring   GAIT: Assistive device utilized: None Comments: mild trendelenburg gait pattern with ambulation   POSTURE: rounded shoulders, forward head, increased lumbar lordosis, and flexed trunk    LUMBARAROM/PROM: within normal limits for all motions bilaterally with no pain   LOWER EXTREMITY ROM:  within normal limits for all motions bilaterally with no pain  LOWER EXTREMITY MMT: 4/5 bilateral knees and hips grossly   PALPATION:   General: no tenderness to palpation of bilateral adductors or hip flexors in supine   Pelvic Alignment: anterior pelvic tilt   Abdominal: upper chest breathing, abdominal bracing at rest, decreased lower rib excursion with  inhalation                 External Perineal Exam: mild dryness present with sufficient clitoral hood mobility                              Internal Pelvic Floor: Patient demonstrates weakness in bilateral aspects of superficial and deep pelvic floor musculature. She had no pain or palpable trigger points during today's exam. She demonstrates a lack of coordination in the pelvic floor that is limiting her active range of motion.  Patient confirms identification and approves PT to assess internal pelvic floor and treatment Yes  PELVIC MMT:   MMT eval  Vaginal 3/5, 10 quick flicks, 10 sec hold  Internal Anal Sphincter   External Anal Sphincter   Puborectalis   Diastasis Recti   (Blank rows = not tested)  TONE: Low in bilateral aspects of superficial and deep pelvic floor musculature   PROLAPSE: None present in hooklying position  TODAY'S TREATMENT:  DATE:   01/22/24: Sitting on ball: X15 pelvic circles rt/lt X15 ant/post pelvic tilts  Hip shifts x10 each +2x30s each Blue band rows 2x10 Blue band bil shoulder horizontal abduction 2x10 2x10  blue band diagonals each way angel wing stretches x15 Educated on transverse abdominis activation with log rolling, car transfers and putting on pants to decreased pain at pubic symphysis and pt reports she will attempt, demonstrated good mechanics with exhale with education.    01/28/24: NuStep level 2, 6 minutes, PT present to discuss current status  Sitting on ball: X15 pelvic circles rt/lt X15 ant/post pelvic tilts Hip shifts x10 each +2x30s each Blue band rows 2x10 Blue band bil shoulder horizontal abduction 2x10 2x10 blue band diagonals each way angel wing stretches x15 Sidelying open books + diaphragmatic breathing 2x10  Sidelying reverse clamshells + diaphragmatic breathing 2x10  Quadruped rocking into externally rotated hips + diaphragmatic breathing 2x10  Quadruped thread the needle + diaphragmatic breathing 2x5 each side  Seated shoulder rolls forward/backward 2x20   02/04/24: NuStep level 2, 6 minutes, PT present to discuss current status  Bridge + adductor ball squeeze + diaphragmatic breathing 2x10  Hooklying ball squeezes + diaphragmatic breathing 2x10  Sidelying reverse clam with knee on foam roll + diaphragmatic breathing 2x10  Quadruped rocking into internally and externally rotated hips + diaphragmatic breathing 2x10  Seated hip abduction + GTB + diaphragmatic breathing 2x10  Seated marching + GTB + diaphragmatic breathing 2x10  Sitting on ball: X15 pelvic circles rt/lt X15 ant/post pelvic tilts Hip shifts x10 each +2x30s each Blue band rows 2x10 Blue band bil shoulder horizontal abduction 2x10 2x10 blue band diagonals each way angel wing stretches x15   PATIENT EDUCATION:  Education details: relative anatomy and the connection between the diaphragm and pelvic floor, water intake and bladder irritants  Person educated: Patient Education method: Explanation, Demonstration, Tactile cues, Verbal cues, and Handouts Education comprehension: verbalized  understanding, returned demonstration, verbal cues required, tactile cues required, and needs further education  HOME EXERCISE PROGRAM: Access Code: [email or web address] URL: [email or web address] Date: 02/04/2024 Prepared by: Celena Domino  Exercises - Supine Pelvic Floor Contraction  - 1 x daily - 7 x weekly - 2 sets - 10 reps - Supine Butterfly Groin Stretch  - 1 x daily - 7 x weekly - 2 sets - hold - Child's Pose Stretch  - 1 x daily - 7 x weekly - 2 sets - hold - Cat Cow  - 1 x daily - 7 x weekly - 1 sets - 10 reps - Sidelying Open Book Thoracic Lumbar Rotation and Extension  - 1 x daily - 7 x weekly - 2 sets - 10 reps - Sidelying Reverse Clamshell  - 1 x daily - 7 x weekly - 2 sets - 10 reps - 2x30s hold - Seated Hip Adduction Squeeze with Ball  - 1 x daily - 7 x weekly - 2 sets - 10 reps - Quadruped Rocking Slow  - 1 x daily - 7 x weekly - 2 sets - 10 reps - Seated Hip Abduction with Resistance  - 1 x daily - 7 x weekly - 2 sets - 10 reps - Seated March with Resistance  - 1 x daily - 7 x weekly - 2 sets - 10 reps  ASSESSMENT:  CLINICAL IMPRESSION: Patient is a 23 y.o. female  who was seen today for physical therapy treatment for pelvic floor instability and pregnancy.  Pt tolerated all birth prep exercise progressions  very well. She tolerated addition of resistance-based hip training to promote pelvic stability through third trimester. Pt tolerated well, denied pain throughout session. Pt reports she feels like she is getting better overall. Pt would benefit from additional PT to further address deficits.    OBJECTIVE IMPAIRMENTS: decreased coordination, decreased endurance, decreased mobility, decreased ROM, decreased strength, and pain.   ACTIVITY LIMITATIONS: carrying, lifting, bending, sitting, standing, squatting, stairs, transfers, and bed mobility  PARTICIPATION LIMITATIONS: meal prep, cleaning, laundry, and community activity  PERSONAL FACTORS: Past/current  experiences and Time since onset of injury/illness/exacerbation are also affecting patient's functional outcome.   REHAB POTENTIAL: Good  CLINICAL DECISION MAKING: Stable/uncomplicated  EVALUATION COMPLEXITY: Low   GOALS: Goals reviewed with patient? Yes  SHORT TERM GOALS: Target date: 01/21/2024  Pt will be independent with HEP.  Baseline: Goal status: MET  2.  Pt will be independent with diaphragmatic breathing and down training activities in order to improve pelvic floor relaxation. Baseline:  Goal status: MET  3.  Pt will be able to correctly perform diaphragmatic breathing and appropriate pressure management in order to prevent worsening vaginal wall laxity and improve pelvic floor A/ROM.  Baseline:  Goal status: MET  LONG TERM GOALS: Target date: 06/22/2024  Pt will be independent with advanced HEP.  Baseline:  Goal status: on going  2.  Pt to demonstrate improved coordination of pelvic floor and breathing mechanics with 10# squat with appropriate synergistic patterns to decrease pain and leakage at least 75% of the time for improved ability to complete a 30 minute workout without strain at pelvic floor and symptoms.   Baseline:  Goal status: on going  3.  Pt will report 75% reduction of pain due to improvements in posture, strength, and muscle length in pelvic floor to improve quality of life and decrease functional limitations due to pain. Baseline:  Goal status: on going  PLAN:  PT FREQUENCY: 1-2x/week  PT DURATION: 12 weeks  PLANNED INTERVENTIONS: 97110-Therapeutic exercises, 97530- Therapeutic activity, 97112- Neuromuscular re-education, 97535- Self Care, 02859- Manual therapy, Patient/Family education, Taping, Joint mobilization, Spinal mobilization, Scar mobilization, Cryotherapy, and Moist heat  PLAN FOR NEXT SESSION: continued pelvic floor training in seated position, introduce birth prep, taping for belly/sacrum   Celena Domino, PT, DPT 02/04/24 2:30  PM   [facility] [address] [place], [place] [address] Phone # [phone] Fax [phone]

## 2024-02-11 ENCOUNTER — Ambulatory Visit (INDEPENDENT_AMBULATORY_CARE_PROVIDER_SITE_OTHER): Admitting: Obstetrics and Gynecology

## 2024-02-11 ENCOUNTER — Ambulatory Visit: Admitting: Physical Therapy

## 2024-02-11 ENCOUNTER — Other Ambulatory Visit (HOSPITAL_COMMUNITY)
Admission: RE | Admit: 2024-02-11 | Discharge: 2024-02-11 | Disposition: A | Discharge status: Home / Self Care | Source: Ambulatory Visit | Attending: Obstetrics and Gynecology | Admitting: Obstetrics and Gynecology

## 2024-02-11 VITALS — BP 119/74 | HR 91 | Wt 189.0 lb

## 2024-02-11 DIAGNOSIS — Z3A34 34 weeks gestation of pregnancy: Secondary | ICD-10-CM | POA: Diagnosis not present

## 2024-02-11 DIAGNOSIS — M6281 Muscle weakness (generalized): Secondary | ICD-10-CM

## 2024-02-11 DIAGNOSIS — O26893 Other specified pregnancy related conditions, third trimester: Secondary | ICD-10-CM

## 2024-02-11 DIAGNOSIS — N898 Other specified noninflammatory disorders of vagina: Secondary | ICD-10-CM | POA: Diagnosis not present

## 2024-02-11 DIAGNOSIS — Z113 Encounter for screening for infections with a predominantly sexual mode of transmission: Secondary | ICD-10-CM | POA: Insufficient documentation

## 2024-02-11 DIAGNOSIS — R293 Abnormal posture: Secondary | ICD-10-CM

## 2024-02-11 DIAGNOSIS — R279 Unspecified lack of coordination: Secondary | ICD-10-CM

## 2024-02-11 NOTE — Therapy (Signed)
 OUTPATIENT PHYSICAL THERAPY FEMALE PELVIC TREATMENT   Patient Name: Melissa Navarro MRN: [id number] DOB:2000/06/10, 23 y.o., female Today's Date: 02/11/2024  END OF SESSION:  PT End of Session - 02/11/24 1457     Visit Number 7    Number of Visits 12    Date for Recertification  03/17/24    Authorization Type Medicaid [employer]    Authorization Time Period RADMD Approved 10vl 12/24/2023-02/22/2024    Authorization - Visit Number 7    Authorization - Number of Visits 10    PT Start Time 0245    PT Stop Time 0330    PT Time Calculation (min) 45 min    Activity Tolerance Patient tolerated treatment well    Behavior During Therapy WFL for tasks assessed/performed               Past Medical History:  Diagnosis Date   ADHD (attention deficit hyperactivity disorder)    Anxiety    Depression    No past surgical history on file. Patient Active Problem List   Diagnosis Date Noted   Trichomonal vaginitis during pregnancy in first trimester 09/23/2023   Supervision of other normal pregnancy, antepartum 09/19/2023   Borderline personality disorder (HCC) 05/20/2020   Attention deficit hyperactivity disorder (ADHD) 02/22/2016   MDD (major depressive disorder), recurrent severe, without psychosis (HCC) 02/21/2016    PCP: none per chart  REFERRING PROVIDER: Regino Camie LABOR, CNM  REFERRING DIAG: M62.89 (ICD-10-CM) - Pelvic floor instability  THERAPY DIAG:  Muscle weakness (generalized)  Unspecified lack of coordination  Abnormal posture  Rationale for Evaluation and Treatment: Rehabilitation  ONSET DATE: 2025  SUBJECTIVE:                                                                                                                                                                                           SUBJECTIVE STATEMENT: [name] - ki-ah  [redacted] weeks pregnant - she is half a cm dilated and she learned this an hour ago. She is feeling good overall, but this morning she  had some right sided flank pain.  Fluid intake: tries to drink water throughout the day but struggles with this, some soda here and there   PAIN:  Are you having pain? Yes NPRS scale: 0/10 Pain location: Internal, Deep, Bilateral, and Anterior  Pain type: aching Pain description: intermittent   Aggravating factors: movement  Relieving factors: unknown, sometimes stretches   PRECAUTIONS: Other: currently pregnant   RED FLAGS: None   WEIGHT BEARING RESTRICTIONS: No  FALLS:  Has patient fallen in last 6 months? No  OCCUPATION: stay at home mom  ACTIVITY LEVEL :  moderate considering she is taking care of her toddler who has autism   PLOF: Independent  PATIENT GOALS: decrease pain during this pregnancy and get stronger   PERTINENT HISTORY:  Hx: ADHD, anxiety, depression  BOWEL MOVEMENT: Pain with bowel movement: Yes Type of bowel movement:Type (Bristol Stool Scale) 4, Frequency within normal limits , Strain no, and Splinting no Fully empty rectum: Yes:   Leakage: No Pads: No Fiber supplement/laxative No  URINATION: Pain with urination: No Fully empty bladder: Yes:   Stream: Strong Urgency: Yes  Frequency: within normal limits  Leakage: none Pads: No  INTERCOURSE: currently sexually active   Ability to have vaginal penetration Yes  Pain with intercourse: none DrynessNo Climax: yes Marinoff Scale: 0/3  PREGNANCY: Vaginal deliveries 1 Tearing No Episiotomy No C-section deliveries 0 Currently pregnant Yes: 27 weeks   PROLAPSE: None  OBJECTIVE:  Note: Objective measures were completed at Evaluation unless otherwise noted.  PATIENT SURVEYS:  PFIQ-7: 45  COGNITION: Overall cognitive status: Within functional limits for tasks assessed     SENSATION: Light touch: Appears intact  LUMBAR SPECIAL TESTS:  Single leg stance test: Positive  FUNCTIONAL TESTS:  Squat: bilateral dynamic knee valgus with loading, general lumbopelvic stiffness present with  transferring   GAIT: Assistive device utilized: None Comments: mild trendelenburg gait pattern with ambulation   POSTURE: rounded shoulders, forward head, increased lumbar lordosis, and flexed trunk    LUMBARAROM/PROM: within normal limits for all motions bilaterally with no pain   LOWER EXTREMITY ROM:  within normal limits for all motions bilaterally with no pain  LOWER EXTREMITY MMT: 4/5 bilateral knees and hips grossly   PALPATION:   General: no tenderness to palpation of bilateral adductors or hip flexors in supine   Pelvic Alignment: anterior pelvic tilt   Abdominal: upper chest breathing, abdominal bracing at rest, decreased lower rib excursion with  inhalation                 External Perineal Exam: mild dryness present with sufficient clitoral hood mobility                              Internal Pelvic Floor: Patient demonstrates weakness in bilateral aspects of superficial and deep pelvic floor musculature. She had no pain or palpable trigger points during today's exam. She demonstrates a lack of coordination in the pelvic floor that is limiting her active range of motion.  Patient confirms identification and approves PT to assess internal pelvic floor and treatment Yes  PELVIC MMT:   MMT eval  Vaginal 3/5, 10 quick flicks, 10 sec hold  Internal Anal Sphincter   External Anal Sphincter   Puborectalis   Diastasis Recti   (Blank rows = not tested)  TONE: Low in bilateral aspects of superficial and deep pelvic floor musculature   PROLAPSE: None present in hooklying position  TODAY'S TREATMENT:  DATE:    01/28/24: NuStep level 2, 6 minutes, PT present to discuss current status  Sitting on ball: X15 pelvic circles rt/lt X15 ant/post pelvic tilts Hip shifts x10 each +2x30s each Blue band rows 2x10 Blue band bil shoulder horizontal abduction  2x10 2x10 blue band diagonals each way angel wing stretches x15 Sidelying open books + diaphragmatic breathing 2x10  Sidelying reverse clamshells + diaphragmatic breathing 2x10  Quadruped rocking into externally rotated hips + diaphragmatic breathing 2x10  Quadruped thread the needle + diaphragmatic breathing 2x5 each side  Seated shoulder rolls forward/backward 2x20   02/04/24: NuStep level 2, 6 minutes, PT present to discuss current status  Bridge + adductor ball squeeze + diaphragmatic breathing 2x10  Hooklying ball squeezes + diaphragmatic breathing 2x10  Sidelying reverse clam with knee on foam roll + diaphragmatic breathing 2x10  Quadruped rocking into internally and externally rotated hips + diaphragmatic breathing 2x10  Seated hip abduction + GTB + diaphragmatic breathing 2x10  Seated marching + GTB + diaphragmatic breathing 2x10  Sitting on ball: X15 pelvic circles rt/lt X15 ant/post pelvic tilts Hip shifts x10 each +2x30s each Blue band rows 2x10 Blue band bil shoulder horizontal abduction 2x10 2x10 blue band diagonals each way angel wing stretches x15  02/11/24: NuStep level 2, 6 minutes, PT present to discuss current status  Bridge + adductor ball squeeze + diaphragmatic breathing 2x10  Sidelying hip abduction + ball press + diaphragmatic breathing 2x10 each side  Standing pull down (RTB) + single leg march + diaphragmatic breathing 2x12  Standing RDL with RTB, GTB + diaphragmatic breathing 2x10  Reclined internal and external rotation + diaphragmatic breathing 2x10  Sidelying reverse clam with knee on foam roll + diaphragmatic breathing 2x10  Sidelying open books + diaphragmatic breathing 2x10  Seated physioball pelvic tilts + diaphragmatic breathing 2x10   PATIENT EDUCATION:  Education details: relative anatomy and the connection between the diaphragm and pelvic floor, water intake and bladder irritants  Person educated: Patient Education method: Explanation,  Demonstration, Tactile cues, Verbal cues, and Handouts Education comprehension: verbalized understanding, returned demonstration, verbal cues required, tactile cues required, and needs further education  HOME EXERCISE PROGRAM: Access Code: [email or web address] URL: [email or web address] Date: 02/04/2024 Prepared by: Celena Domino  Exercises - Supine Pelvic Floor Contraction  - 1 x daily - 7 x weekly - 2 sets - 10 reps - Supine Butterfly Groin Stretch  - 1 x daily - 7 x weekly - 2 sets - hold - Child's Pose Stretch  - 1 x daily - 7 x weekly - 2 sets - hold - Cat Cow  - 1 x daily - 7 x weekly - 1 sets - 10 reps - Sidelying Open Book Thoracic Lumbar Rotation and Extension  - 1 x daily - 7 x weekly - 2 sets - 10 reps - Sidelying Reverse Clamshell  - 1 x daily - 7 x weekly - 2 sets - 10 reps - 2x30s hold - Seated Hip Adduction Squeeze with Ball  - 1 x daily - 7 x weekly - 2 sets - 10 reps - Quadruped Rocking Slow  - 1 x daily - 7 x weekly - 2 sets - 10 reps - Seated Hip Abduction with Resistance  - 1 x daily - 7 x weekly - 2 sets - 10 reps - Seated March with Resistance  - 1 x daily - 7 x weekly - 2 sets - 10 reps  ASSESSMENT:  CLINICAL IMPRESSION: Patient is a 23 y.o.  female  who was seen today for physical therapy treatment for pelvic floor instability and pregnancy. Progressive gluteus medius and transverse abdominis exercises introduced today. Deadlifts and full body marching/pulling introduced today and pt required very minimal cueing for this. Pt tolerated well, denied pain throughout session. Pt reports she feels like she is getting better overall. Pt would benefit from additional PT to further address deficits.    OBJECTIVE IMPAIRMENTS: decreased coordination, decreased endurance, decreased mobility, decreased ROM, decreased strength, and pain.   ACTIVITY LIMITATIONS: carrying, lifting, bending, sitting, standing, squatting, stairs, transfers, and bed  mobility  PARTICIPATION LIMITATIONS: meal prep, cleaning, laundry, and community activity  PERSONAL FACTORS: Past/current experiences and Time since onset of injury/illness/exacerbation are also affecting patient's functional outcome.   REHAB POTENTIAL: Good  CLINICAL DECISION MAKING: Stable/uncomplicated  EVALUATION COMPLEXITY: Low   GOALS: Goals reviewed with patient? Yes  SHORT TERM GOALS: Target date: 01/21/2024  Pt will be independent with HEP.  Baseline: Goal status: MET  2.  Pt will be independent with diaphragmatic breathing and down training activities in order to improve pelvic floor relaxation. Baseline:  Goal status: MET  3.  Pt will be able to correctly perform diaphragmatic breathing and appropriate pressure management in order to prevent worsening vaginal wall laxity and improve pelvic floor A/ROM.  Baseline:  Goal status: MET  LONG TERM GOALS: Target date: 06/22/2024  Pt will be independent with advanced HEP.  Baseline:  Goal status: on going  2.  Pt to demonstrate improved coordination of pelvic floor and breathing mechanics with 10# squat with appropriate synergistic patterns to decrease pain and leakage at least 75% of the time for improved ability to complete a 30 minute workout without strain at pelvic floor and symptoms.   Baseline:  Goal status: on going  3.  Pt will report 75% reduction of pain due to improvements in posture, strength, and muscle length in pelvic floor to improve quality of life and decrease functional limitations due to pain. Baseline:  Goal status: on going  PLAN:  PT FREQUENCY: 1-2x/week  PT DURATION: 12 weeks  PLANNED INTERVENTIONS: 97110-Therapeutic exercises, 97530- Therapeutic activity, 97112- Neuromuscular re-education, 97535- Self Care, 02859- Manual therapy, Patient/Family education, Taping, Joint mobilization, Spinal mobilization, Scar mobilization, Cryotherapy, and Moist heat  PLAN FOR NEXT SESSION: continued pelvic  floor training in seated position, introduce birth prep, taping for belly/sacrum   Celena Domino, PT, DPT 02/11/24 3:31 PM   [facility] [address], [address] [place], [place] [address] Phone # [phone] Fax [phone]

## 2024-02-11 NOTE — Progress Notes (Signed)
   PRENATAL VISIT NOTE  Subjective:  Melissa Navarro is a 23 y.o. G3P1011 at [redacted]w[redacted]d being seen today for ongoing prenatal care.  She is currently monitored for the following issues for this low-risk pregnancy and has MDD (major depressive disorder), recurrent severe, without psychosis (HCC); Attention deficit hyperactivity disorder (ADHD); Borderline personality disorder (HCC); Supervision of other normal pregnancy, antepartum; and Trichomonal vaginitis during pregnancy in first trimester on their problem list.  Patient reports occasional contractions.  Contractions: Irritability. Vag. Bleeding: None.  Movement: Present. Denies leaking of fluid.   The following portions of the patient's history were reviewed and updated as appropriate: allergies, current medications, past family history, past medical history, past social history, past surgical history and problem list.   Objective:    Vitals:   02/11/24 1304  BP: 119/74  Pulse: 91  Weight: 189 lb (85.7 kg)    Fetal Status:  Fetal Heart Rate (bpm): 138   Movement: Present    General: Alert, oriented and cooperative. Patient is in no acute distress.  Skin: Skin is warm and dry. No rash noted.   Cardiovascular: Normal heart rate noted  Respiratory: Normal respiratory effort, no problems with respiration noted  Abdomen: Soft, gravid, appropriate for gestational age.  Pain/Pressure: Present     Pelvic: Cervical exam performed in the presence of a chaperone Dilation: Fingertip Effacement (%): Thick Station: -3  Extremities: Normal range of motion.  Edema: None  Mental Status: Normal mood and affect. Normal behavior. Normal judgment and thought content.   Assessment and Plan:  Pregnancy: G3P1011 at [redacted]w[redacted]d  1. Vaginal discharge during pregnancy in third trimester (Primary)  - Cervicovaginal ancillary only( [place])  2. [redacted] weeks gestation of pregnancy  -Cervicovaginal ancillary only( [place]) - Reports good fetal movement.  - BP  normal.   3. Screening examination for STD (sexually transmitted disease)  - Cervicovaginal ancillary only( [place])    Preterm labor symptoms and general obstetric precautions including but not limited to vaginal bleeding, contractions, leaking of fluid and fetal movement were reviewed in detail with the patient. Please refer to After Visit Summary for other counseling recommendations.   No follow-ups on file.  Future Appointments  Date Time Provider Department Center  02/11/2024  2:45 PM Gretel Celena BROCKS, PT [facility] None  02/18/2024 11:45 AM Gretel Celena C, PT [facility] None  02/25/2024 12:30 PM Donah Darryle RAMAN, PT [facility] None  02/25/2024  3:50 PM Aidaly Cordner, Delon FERNS, NP [facility]  03/03/2024 12:30 PM Gretel Celena BROCKS, PT [facility] None  03/03/2024  3:30 PM Jaesean Litzau, Delon FERNS, NP [facility]  03/10/2024 12:30 PM Gretel Celena BROCKS, PT [facility] None  03/10/2024  3:30 PM Aaro Meyers, Delon FERNS, NP [facility]  03/17/2024  1:10 PM Amayrany Cafaro, Delon FERNS, NP [facility]    Delon Emms, NP

## 2024-02-12 LAB — CERVICOVAGINAL ANCILLARY ONLY
Bacterial Vaginitis (gardnerella): POSITIVE — AB
Candida Glabrata: NEGATIVE
Candida Vaginitis: POSITIVE — AB
Chlamydia: NEGATIVE
Comment: NEGATIVE
Comment: NEGATIVE
Comment: NEGATIVE
Comment: NEGATIVE
Comment: NEGATIVE
Comment: NORMAL
Neisseria Gonorrhea: NEGATIVE
Trichomonas: NEGATIVE

## 2024-02-13 ENCOUNTER — Ambulatory Visit: Payer: Self-pay | Admitting: Obstetrics and Gynecology

## 2024-02-13 MED ORDER — METRONIDAZOLE 500 MG PO TABS: 500.0000 mg | ORAL_TABLET | Freq: Two times a day (BID) | ORAL | 0 refills | Status: DC

## 2024-02-18 ENCOUNTER — Ambulatory Visit: Admitting: Physical Therapy

## 2024-02-18 ENCOUNTER — Telehealth: Payer: Self-pay | Admitting: Physical Therapy

## 2024-02-18 ENCOUNTER — Ambulatory Visit: Attending: Certified Nurse Midwife | Admitting: Physical Therapy

## 2024-02-18 DIAGNOSIS — M6281 Muscle weakness (generalized): Secondary | ICD-10-CM

## 2024-02-18 DIAGNOSIS — R279 Unspecified lack of coordination: Secondary | ICD-10-CM | POA: Diagnosis not present

## 2024-02-18 DIAGNOSIS — R293 Abnormal posture: Secondary | ICD-10-CM | POA: Diagnosis not present

## 2024-02-18 NOTE — Telephone Encounter (Signed)
 Pt informed of missed appt this morning at 11:45 AM. Pt forgot about appt and wishes to come in at [facility] per PTs schedule.  Celena Domino, PT, DPT 02/18/24 12:17 PM [facility] [address] [place], [place] [address] Phone # [phone] Fax [phone]

## 2024-02-18 NOTE — Therapy (Signed)
 OUTPATIENT PHYSICAL THERAPY FEMALE PELVIC TREATMENT   Patient Name: Melissa Navarro MRN: [id number] DOB:2000/10/10, 23 y.o., female Today's Date: 02/18/2024  END OF SESSION:  PT End of Session - 02/18/24 1428     Visit Number 8    Number of Visits 12    Date for Recertification  03/17/24    Authorization Type Medicaid [employer]    Authorization Time Period RADMD Approved 10vl 12/24/2023-02/22/2024    Authorization - Visit Number 8    Authorization - Number of Visits 10    PT Start Time 0200    PT Stop Time 0230    PT Time Calculation (min) 30 min    Activity Tolerance Patient tolerated treatment well    Behavior During Therapy WFL for tasks assessed/performed                Past Medical History:  Diagnosis Date   ADHD (attention deficit hyperactivity disorder)    Anxiety    Depression    No past surgical history on file. Patient Active Problem List   Diagnosis Date Noted   Trichomonal vaginitis during pregnancy in first trimester 09/23/2023   Supervision of other normal pregnancy, antepartum 09/19/2023   Borderline personality disorder (HCC) 05/20/2020   Attention deficit hyperactivity disorder (ADHD) 02/22/2016   MDD (major depressive disorder), recurrent severe, without psychosis (HCC) 02/21/2016    PCP: none per chart  REFERRING PROVIDER: Regino Camie LABOR, CNM  REFERRING DIAG: M62.89 (ICD-10-CM) - Pelvic floor instability  THERAPY DIAG:  Muscle weakness (generalized)  Unspecified lack of coordination  Abnormal posture  Rationale for Evaluation and Treatment: Rehabilitation  ONSET DATE: 2025  SUBJECTIVE:                                                                                                                                                                                           SUBJECTIVE STATEMENT: [name] - ki-ah  [redacted] weeks pregnant - She is feeling heavy and tired, feeling heaviness in the pelvic floor also.  Fluid intake: tries to drink  water throughout the day but struggles with this, some soda here and there   PAIN:  Are you having pain? Yes NPRS scale: 0/10 Pain location: Internal, Deep, Bilateral, and Anterior  Pain type: aching Pain description: intermittent   Aggravating factors: movement  Relieving factors: unknown, sometimes stretches   PRECAUTIONS: Other: currently pregnant   RED FLAGS: None   WEIGHT BEARING RESTRICTIONS: No  FALLS:  Has patient fallen in last 6 months? No  OCCUPATION: stay at home mom  ACTIVITY LEVEL : moderate considering she is taking care of her toddler who has autism  PLOF: Independent  PATIENT GOALS: decrease pain during this pregnancy and get stronger   PERTINENT HISTORY:  Hx: ADHD, anxiety, depression  BOWEL MOVEMENT: Pain with bowel movement: Yes Type of bowel movement:Type (Bristol Stool Scale) 4, Frequency within normal limits , Strain no, and Splinting no Fully empty rectum: Yes:   Leakage: No Pads: No Fiber supplement/laxative No  URINATION: Pain with urination: No Fully empty bladder: Yes:   Stream: Strong Urgency: Yes  Frequency: within normal limits  Leakage: none Pads: No  INTERCOURSE: currently sexually active   Ability to have vaginal penetration Yes  Pain with intercourse: none DrynessNo Climax: yes Marinoff Scale: 0/3  PREGNANCY: Vaginal deliveries 1 Tearing No Episiotomy No C-section deliveries 0 Currently pregnant Yes: 27 weeks   PROLAPSE: None  OBJECTIVE:  Note: Objective measures were completed at Evaluation unless otherwise noted.  PATIENT SURVEYS:  PFIQ-7: 45  COGNITION: Overall cognitive status: Within functional limits for tasks assessed     SENSATION: Light touch: Appears intact  LUMBAR SPECIAL TESTS:  Single leg stance test: Positive  FUNCTIONAL TESTS:  Squat: bilateral dynamic knee valgus with loading, general lumbopelvic stiffness present with transferring   GAIT: Assistive device utilized:  None Comments: mild trendelenburg gait pattern with ambulation   POSTURE: rounded shoulders, forward head, increased lumbar lordosis, and flexed trunk    LUMBARAROM/PROM: within normal limits for all motions bilaterally with no pain   LOWER EXTREMITY ROM:  within normal limits for all motions bilaterally with no pain  LOWER EXTREMITY MMT: 4/5 bilateral knees and hips grossly   PALPATION:   General: no tenderness to palpation of bilateral adductors or hip flexors in supine   Pelvic Alignment: anterior pelvic tilt   Abdominal: upper chest breathing, abdominal bracing at rest, decreased lower rib excursion with  inhalation                 External Perineal Exam: mild dryness present with sufficient clitoral hood mobility                              Internal Pelvic Floor: Patient demonstrates weakness in bilateral aspects of superficial and deep pelvic floor musculature. She had no pain or palpable trigger points during today's exam. She demonstrates a lack of coordination in the pelvic floor that is limiting her active range of motion.  Patient confirms identification and approves PT to assess internal pelvic floor and treatment Yes  PELVIC MMT:   MMT eval  Vaginal 3/5, 10 quick flicks, 10 sec hold  Internal Anal Sphincter   External Anal Sphincter   Puborectalis   Diastasis Recti   (Blank rows = not tested)  TONE: Low in bilateral aspects of superficial and deep pelvic floor musculature   PROLAPSE: None present in hooklying position  TODAY'S TREATMENT:  DATE:    02/04/24: NuStep level 2, 6 minutes, PT present to discuss current status  Bridge + adductor ball squeeze + diaphragmatic breathing 2x10  Hooklying ball squeezes + diaphragmatic breathing 2x10  Sidelying reverse clam with knee on foam roll + diaphragmatic breathing 2x10  Quadruped rocking  into internally and externally rotated hips + diaphragmatic breathing 2x10  Seated hip abduction + GTB + diaphragmatic breathing 2x10  Seated marching + GTB + diaphragmatic breathing 2x10  Sitting on ball: X15 pelvic circles rt/lt X15 ant/post pelvic tilts Hip shifts x10 each +2x30s each Blue band rows 2x10 Blue band bil shoulder horizontal abduction 2x10 2x10 blue band diagonals each way angel wing stretches x15  02/11/24: NuStep level 2, 6 minutes, PT present to discuss current status  Bridge + adductor ball squeeze + diaphragmatic breathing 2x10  Sidelying hip abduction + ball press + diaphragmatic breathing 2x10 each side  Standing pull down (RTB) + single leg march + diaphragmatic breathing 2x12  Standing RDL with RTB, GTB + diaphragmatic breathing 2x10  Reclined internal and external rotation + diaphragmatic breathing 2x10  Sidelying reverse clam with knee on foam roll + diaphragmatic breathing 2x10  Sidelying open books + diaphragmatic breathing 2x10  Seated physioball pelvic tilts + diaphragmatic breathing 2x10  02/18/24: Pt has to leave by 2:30 to get to her son's appt  NuStep level 2, 4 minutes, PT present to discuss current status  Quadruped rocking into externally rotated hips + diaphragmatic breathing 2x24min Quadruped rocking into internally rotated hips + diaphragmatic breathing 2x67min  Reclined internal and external rotation + diaphragmatic breathing 2x10  Bridge + adductor ball squeeze + diaphragmatic breathing 2x10  Good mornings in seated position with legs externally rotated + diaphragmatic breathing 2x10 (holding 10#)  Sidelying clamshell + diaphragmatic breathing 2x10  Sidelying reverse clamshell + diaphragmatic breathing 2x10    PATIENT EDUCATION:  Education details: relative anatomy and the connection between the diaphragm and pelvic floor, water intake and bladder irritants  Person educated: Patient Education method: Explanation, Demonstration, Tactile cues,  Verbal cues, and Handouts Education comprehension: verbalized understanding, returned demonstration, verbal cues required, tactile cues required, and needs further education  HOME EXERCISE PROGRAM: Access Code: [email or web address] URL: [email or web address] Date: 02/04/2024 Prepared by: Celena Domino  Exercises - Supine Pelvic Floor Contraction  - 1 x daily - 7 x weekly - 2 sets - 10 reps - Supine Butterfly Groin Stretch  - 1 x daily - 7 x weekly - 2 sets - hold - Child's Pose Stretch  - 1 x daily - 7 x weekly - 2 sets - hold - Cat Cow  - 1 x daily - 7 x weekly - 1 sets - 10 reps - Sidelying Open Book Thoracic Lumbar Rotation and Extension  - 1 x daily - 7 x weekly - 2 sets - 10 reps - Sidelying Reverse Clamshell  - 1 x daily - 7 x weekly - 2 sets - 10 reps - 2x30s hold - Seated Hip Adduction Squeeze with Ball  - 1 x daily - 7 x weekly - 2 sets - 10 reps - Quadruped Rocking Slow  - 1 x daily - 7 x weekly - 2 sets - 10 reps - Seated Hip Abduction with Resistance  - 1 x daily - 7 x weekly - 2 sets - 10 reps - Seated March with Resistance  - 1 x daily - 7 x weekly - 2 sets - 10 reps  ASSESSMENT:  CLINICAL IMPRESSION: Patient is  a 23 y.o. female  who was seen today for physical therapy treatment for pelvic floor instability and pregnancy. She is 35 weeks and feeling tired and full. She has had some pelvic floor pain and we reviewed deep diaphragmatic breathing cues for this. Progressive gluteus medius and transverse abdominis exercises introduced today. She tolerated 10lb weight addition to deadlifting and good morning exercises to promote gluteal strengthening for pelvic floor support. Pt tolerated well, denied pain throughout session. Pt reports she feels like she is getting better overall. Pt would benefit from additional PT to further address deficits.    OBJECTIVE IMPAIRMENTS: decreased coordination, decreased endurance, decreased mobility, decreased ROM, decreased strength, and  pain.   ACTIVITY LIMITATIONS: carrying, lifting, bending, sitting, standing, squatting, stairs, transfers, and bed mobility  PARTICIPATION LIMITATIONS: meal prep, cleaning, laundry, and community activity  PERSONAL FACTORS: Past/current experiences and Time since onset of injury/illness/exacerbation are also affecting patient's functional outcome.   REHAB POTENTIAL: Good  CLINICAL DECISION MAKING: Stable/uncomplicated  EVALUATION COMPLEXITY: Low   GOALS: Goals reviewed with patient? Yes  SHORT TERM GOALS: Target date: 01/21/2024  Pt will be independent with HEP.  Baseline: Goal status: MET  2.  Pt will be independent with diaphragmatic breathing and down training activities in order to improve pelvic floor relaxation. Baseline:  Goal status: MET  3.  Pt will be able to correctly perform diaphragmatic breathing and appropriate pressure management in order to prevent worsening vaginal wall laxity and improve pelvic floor A/ROM.  Baseline:  Goal status: MET  LONG TERM GOALS: Target date: 06/22/2024  Pt will be independent with advanced HEP.  Baseline:  Goal status: on going  2.  Pt to demonstrate improved coordination of pelvic floor and breathing mechanics with 10# squat with appropriate synergistic patterns to decrease pain and leakage at least 75% of the time for improved ability to complete a 30 minute workout without strain at pelvic floor and symptoms.   Baseline:  Goal status: on going  3.  Pt will report 75% reduction of pain due to improvements in posture, strength, and muscle length in pelvic floor to improve quality of life and decrease functional limitations due to pain. Baseline:  Goal status: on going  PLAN:  PT FREQUENCY: 1-2x/week  PT DURATION: 12 weeks  PLANNED INTERVENTIONS: 97110-Therapeutic exercises, 97530- Therapeutic activity, 97112- Neuromuscular re-education, 97535- Self Care, 02859- Manual therapy, Patient/Family education, Taping, Joint  mobilization, Spinal mobilization, Scar mobilization, Cryotherapy, and Moist heat  PLAN FOR NEXT SESSION: continued pelvic floor training in seated position, introduce birth prep, taping for belly/sacrum   Celena Domino, PT, DPT 02/18/24 2:35 PM   [facility] [address] [place], [place] [address] Phone # [phone] Fax [phone]

## 2024-02-19 ENCOUNTER — Inpatient Hospital Stay (HOSPITAL_COMMUNITY)
Admission: AD | Admit: 2024-02-19 | Discharge: 2024-02-19 | Disposition: A | Discharge status: Home / Self Care | Attending: Obstetrics & Gynecology | Admitting: Obstetrics & Gynecology

## 2024-02-19 ENCOUNTER — Encounter (HOSPITAL_COMMUNITY): Payer: Self-pay | Admitting: Obstetrics & Gynecology

## 2024-02-19 DIAGNOSIS — O4703 False labor before 37 completed weeks of gestation, third trimester: Secondary | ICD-10-CM | POA: Insufficient documentation

## 2024-02-19 DIAGNOSIS — Z3A35 35 weeks gestation of pregnancy: Secondary | ICD-10-CM

## 2024-02-19 DIAGNOSIS — Z3689 Encounter for other specified antenatal screening: Secondary | ICD-10-CM

## 2024-02-19 DIAGNOSIS — O479 False labor, unspecified: Secondary | ICD-10-CM | POA: Diagnosis not present

## 2024-02-19 DIAGNOSIS — Z3493 Encounter for supervision of normal pregnancy, unspecified, third trimester: Secondary | ICD-10-CM

## 2024-02-19 NOTE — MAU Provider Note (Signed)
 Chief Complaint:  Contractions   HPI   Event Date/Time   First Provider Initiated Contact with Patient 02/19/24 1834      Melissa Navarro is a 23 y.o. G3P1011 at [redacted]w[redacted]d who presents to maternity admissions reporting contractions. She reports intense pressure which is in her pelvis. She is unsure of timing of contractions. Denies LOF, VB. Endorses regular and vigorous fetal movement. Has recent treatment fo BV.   Pregnancy Course: KV  Past Medical History:  Diagnosis Date   ADHD (attention deficit hyperactivity disorder)    Anxiety    Depression    PPD   OB History  Gravida Para Term Preterm AB Living  3 1 1  1 1   SAB IAB Ectopic Multiple Live Births  1   0 1    # Outcome Date GA Lbr Len/2nd Weight Sex Type Anes PTL Lv  3 Current           2 Term 06/29/21 [redacted]w[redacted]d 00:55 / 04:12 2780 g M Vag-Spont None  LIV  1 SAB            Past Surgical History:  Procedure Laterality Date   NO PAST SURGERIES     Family History  Problem Relation Age of Onset   Varicose Veins Father    Stomach cancer Maternal Aunt    Stomach cancer Maternal Grandmother    Social History   Tobacco Use   Smoking status: Never   Smokeless tobacco: Never  Vaping Use   Vaping status: Never Used  Substance Use Topics   Alcohol use: Not Currently   Drug use: Not Currently   No Known Allergies No medications prior to admission.    I have reviewed patient's Past Medical Hx, Surgical Hx, Family Hx, Social Hx, medications and allergies.   ROS  Pertinent items noted in HPI and remainder of comprehensive ROS otherwise negative.   PHYSICAL EXAM  Patient Vitals for the past 24 hrs:  BP Temp Temp src Pulse Resp SpO2 Height Weight  02/19/24 2005 113/69 -- -- 79 -- -- -- --  02/19/24 1823 122/77 98 F (36.7 C) Oral 98 16 100 % -- --  02/19/24 1816 -- -- -- -- -- -- 5' 2 (1.575 m) 83.9 kg    Physical Exam Vitals and nursing note reviewed.  Constitutional:      General: She is not in acute distress.     Appearance: Normal appearance. She is not ill-appearing, toxic-appearing or diaphoretic.  HENT:     Head: Normocephalic.  Cardiovascular:     Rate and Rhythm: Normal rate.     Pulses: Normal pulses.  Pulmonary:     Effort: Pulmonary effort is normal.  Abdominal:     Comments: Gravid   Skin:    General: Skin is warm and dry.     Capillary Refill: Capillary refill takes less than 2 seconds.  Neurological:     General: No focal deficit present.     Mental Status: She is alert and oriented to person, place, and time.  Psychiatric:        Mood and Affect: Mood normal.        Behavior: Behavior normal.        Thought Content: Thought content normal.        Judgment: Judgment normal.      Dilation: 1.5 Effacement (%): 80 Cervical Position: Posterior Presentation: Vertex Exam by:: [place],  CNM Sce unchanged in >1 hour  Fetal Tracing: Baseline: 125 Variability:moderate Accelerations: 15x15  Decelerations:absent Toco: 3-5   Labs: No results found for this or any previous visit (from the past 24 hours).  Imaging:  No results found.  MDM & MAU COURSE  MDM: Low Evaluate for labor SCE unchanged in greater than 1 hour.   MAU Course: Orders Placed This Encounter  Procedures   Discharge patient   No orders of the defined types were placed in this encounter.   ASSESSMENT   1. False labor   2. NST (non-stress test) reactive   3. Movement of fetus present during pregnancy in third trimester   4. [redacted] weeks gestation of pregnancy     PLAN  Discharge home in stable condition.  Return precautions advised.  Keep [facility] appointment 02/25/24   Allergies as of 02/19/2024   No Known Allergies      Medication List     TAKE these medications    cyclobenzaprine  10 MG tablet Commonly known as: FLEXERIL  Take 1 tablet (10 mg total) by mouth 3 (three) times daily as needed for muscle spasms.   IRON PO Take 1 tablet by mouth every other day.   metroNIDAZOLE  500 MG  tablet Commonly known as: FLAGYL  Take 1 tablet (500 mg total) by mouth 2 (two) times daily.   multivitamin-prenatal 27-0.8 MG Tabs tablet Take 1 tablet by mouth daily at 12 noon.   terconazole  0.4 % vaginal cream Commonly known as: TERAZOL 7  Place 1 applicator vaginally at bedtime.        Camie Rote, MSN, CNM 02/19/2024 8:23 PM  Certified Nurse Midwife, [facility]

## 2024-02-19 NOTE — MAU Note (Addendum)
.  Melissa Navarro is a 23 y.o. at [redacted]w[redacted]d here in MAU reporting: CTX and intense pressure that comes and goes in her butt and or pelvis, rating the pain an 8/10, which began last night and has persisted into today. Reports lower back pain that comes and goes. Unsure of how close the CTX/pain is, she states she has not timed it. Denies VB and LOF. Reports +FM. Pt states she has BV at this time and is taking medication.  LMP: na Onset of complaint: 11/4  Pain score: 8/10 There were no vitals filed for this visit.   FHT: 133  Lab orders placed from triage: na

## 2024-02-25 ENCOUNTER — Ambulatory Visit: Admitting: Physical Therapy

## 2024-02-25 ENCOUNTER — Other Ambulatory Visit (HOSPITAL_COMMUNITY)
Admission: RE | Admit: 2024-02-25 | Discharge: 2024-02-25 | Disposition: A | Discharge status: Home / Self Care | Source: Ambulatory Visit | Attending: Obstetrics and Gynecology | Admitting: Obstetrics and Gynecology

## 2024-02-25 ENCOUNTER — Ambulatory Visit (INDEPENDENT_AMBULATORY_CARE_PROVIDER_SITE_OTHER): Admitting: Obstetrics and Gynecology

## 2024-02-25 VITALS — BP 117/75 | HR 86 | Wt 194.0 lb

## 2024-02-25 DIAGNOSIS — Z3A36 36 weeks gestation of pregnancy: Secondary | ICD-10-CM

## 2024-02-25 DIAGNOSIS — Z3480 Encounter for supervision of other normal pregnancy, unspecified trimester: Secondary | ICD-10-CM | POA: Insufficient documentation

## 2024-02-25 DIAGNOSIS — M6281 Muscle weakness (generalized): Secondary | ICD-10-CM

## 2024-02-25 DIAGNOSIS — R293 Abnormal posture: Secondary | ICD-10-CM

## 2024-02-25 NOTE — Progress Notes (Signed)
   PRENATAL VISIT NOTE  Subjective:  Melissa Navarro is a 23 y.o. G3P1011 at [redacted]w[redacted]d being seen today for ongoing prenatal care.  She is currently monitored for the following issues for this low-risk pregnancy and has MDD (major depressive disorder), recurrent severe, without psychosis (HCC); Attention deficit hyperactivity disorder (ADHD); Borderline personality disorder (HCC); Supervision of other normal pregnancy, antepartum; and Trichomonal vaginitis during pregnancy in first trimester on their problem list.  Patient reports occasional contrations.  Contractions: Irregular. Vag. Bleeding: None.  Movement: Present. Denies leaking of fluid.   The following portions of the patient's history were reviewed and updated as appropriate: allergies, current medications, past family history, past medical history, past social history, past surgical history and problem list.   Objective:   Vitals:   02/25/24 1545  BP: 117/75  Pulse: 86  Weight: 88 kg    Fetal Status:  Fetal Heart Rate (bpm): 149   Movement: Present    General: Alert, oriented and cooperative. Patient is in no acute distress.  Skin: Skin is warm and dry. No rash noted.   Cardiovascular: Normal heart rate noted  Respiratory: Normal respiratory effort, no problems with respiration noted  Abdomen: Soft, gravid, appropriate for gestational age.  Pain/Pressure: Present     Pelvic: Cervical exam performed in the presence of a chaperone        Extremities: Normal range of motion.  Edema: Trace  Mental Status: Normal mood and affect. Normal behavior. Normal judgment and thought content.      09/19/2023    3:50 PM  Depression screen PHQ 2/9  Decreased Interest 2  Down, Depressed, Hopeless 0  PHQ - 2 Score 2  Altered sleeping 2  Tired, decreased energy 1  Change in appetite 1  Feeling bad or failure about yourself  1  Trouble concentrating 2  Moving slowly or fidgety/restless 0  Suicidal thoughts 0  PHQ-9 Score 9      Data saved  with a previous flowsheet row definition        09/19/2023    3:50 PM  GAD 7 : Generalized Anxiety Score  Nervous, Anxious, on Edge 1  Control/stop worrying 1  Worry too much - different things 1  Trouble relaxing 1  Restless 1  Easily annoyed or irritable 2  Afraid - awful might happen 3  Total GAD 7 Score 10    Assessment and Plan:  Pregnancy: G3P1011 at [redacted]w[redacted]d 1. [redacted] weeks gestation of pregnancy - Reports good fetal movement  2. Supervision of other normal pregnancy, antepartum (Primary) -Requests cervical exam. 1.5cm- J.Rasch -GBS collected today -GC/Chlamydia collected today.  Preterm labor symptoms and general obstetric precautions including but not limited to vaginal bleeding, contractions, leaking of fluid and fetal movement were reviewed in detail with the patient. Please refer to After Visit Summary for other counseling recommendations.   No follow-ups on file.  Future Appointments  Date Time Provider Department Center  03/03/2024 12:30 PM Gretel Celena BROCKS, PT [facility] None  03/03/2024  3:30 PM Rasch, Delon FERNS, NP [facility]  03/10/2024 12:30 PM Gretel Celena BROCKS, PT [facility] None  03/10/2024  3:30 PM Rasch, Delon FERNS, NP [facility]  03/17/2024  1:10 PM Rasch, Delon FERNS, NP [facility]    Osborne Serio VEAR Me, Student-MidWife

## 2024-02-25 NOTE — Therapy (Signed)
 Pt arrived, due to no date extension on insurance requested to wait until we hear back from insurance until she returns for next appointment. Will call pt with updates. No treatment provided.   Darryle Navy, PT, DPT 02/25/2511:52 PM  [facility] [address], [address] [place], [place] [address] Phone # [phone] Fax [phone]

## 2024-02-26 LAB — CERVICOVAGINAL ANCILLARY ONLY
Chlamydia: NEGATIVE
Comment: NEGATIVE
Comment: NORMAL
Neisseria Gonorrhea: NEGATIVE

## 2024-02-27 ENCOUNTER — Telehealth: Payer: Self-pay

## 2024-02-27 LAB — STREP GP B NAA: Strep Gp B NAA: POSITIVE — AB

## 2024-02-27 NOTE — Telephone Encounter (Signed)
 RN followed up with patient after receiving Access Nurse notification of contractions 2 minutes apart last evening. Pt reported was getting ready to go to the hospital and the contractions stopped. Pt reported now/today just having pressure and soreness. Pt reported good fetal movement with no leaking or bleeding. RN reviewed signs and symptoms of preterm labor and when to notify provider and or go to MAU. Pt verbalized understanding.  Silvano LELON Piano, RN

## 2024-02-28 ENCOUNTER — Encounter: Payer: Self-pay | Admitting: Obstetrics and Gynecology

## 2024-02-28 DIAGNOSIS — B951 Streptococcus, group B, as the cause of diseases classified elsewhere: Secondary | ICD-10-CM | POA: Insufficient documentation

## 2024-02-29 ENCOUNTER — Encounter (HOSPITAL_COMMUNITY): Payer: Self-pay | Admitting: Obstetrics & Gynecology

## 2024-02-29 ENCOUNTER — Inpatient Hospital Stay (HOSPITAL_COMMUNITY)
Admission: AD | Admit: 2024-02-29 | Discharge: 2024-02-29 | Disposition: A | Discharge status: Home / Self Care | Attending: Obstetrics & Gynecology | Admitting: Obstetrics & Gynecology

## 2024-02-29 DIAGNOSIS — B951 Streptococcus, group B, as the cause of diseases classified elsewhere: Secondary | ICD-10-CM

## 2024-02-29 DIAGNOSIS — Z3689 Encounter for other specified antenatal screening: Secondary | ICD-10-CM

## 2024-02-29 DIAGNOSIS — O479 False labor, unspecified: Secondary | ICD-10-CM | POA: Diagnosis not present

## 2024-02-29 DIAGNOSIS — O471 False labor at or after 37 completed weeks of gestation: Secondary | ICD-10-CM | POA: Insufficient documentation

## 2024-02-29 DIAGNOSIS — Z3493 Encounter for supervision of normal pregnancy, unspecified, third trimester: Secondary | ICD-10-CM

## 2024-02-29 DIAGNOSIS — Z3A37 37 weeks gestation of pregnancy: Secondary | ICD-10-CM | POA: Diagnosis not present

## 2024-02-29 HISTORY — DX: Anemia, unspecified: D64.9

## 2024-02-29 NOTE — Discharge Instructions (Signed)
 Early Labor Tips   Rest when able, especially if your labor starts at night. Side lying positions can feel better than lying on your back  Move around if you aren't able to rest or if your labor starts at night.  Gentle movement, upright or forward-leaning positions can help your baby to be in a good position and put more pressure on your cervix so it can open. Hip circles standing or on a birth ball can help.  Drink water and eat easily digestible snacks. You may also consider an electrolyte drink (like Gatorade) if you are nauseated or do not have an appetite. Distract yourself with a movie, podcasts, walking outside, music, etc.   The [place] Circuit is a series of exercises you can do in early labor to encourage your baby into a better position to make progress in labor.  It is available with pictures at [email or web address]  Pain management at home  Try any relaxation and breathing techniques you have learned in antenatal classes. Have a massage. Your birth partner could help by rubbing your back. Take acetaminophen  (Tylenol ) according to the instructions on the packet - it's safe to take in labour. Have a warm bath or shower. 5.  You may use a TENS unit if you have access to one.

## 2024-02-29 NOTE — MAU Provider Note (Signed)
 S: Ms. Melissa Navarro is a 23 y.o. G3P1011 at [redacted]w[redacted]d  who presents to MAU today for labor evaluation.   She was evaluated, by the nurse, and has made little to no cervical change since arrival.  The nurse requests provider review strip and discharge as appropriate.   Cervical exam by RN:  Dilation: 2 Effacement (%): 80 Cervical Position: Posterior Station: Ballotable Presentation: Vertex Exam by:: K. Cowher RN  Fetal Monitoring: Baseline: 135 Variability: moderate Accelerations: 15x15 Decelerations: absent Contractions: irregular  MDM Patient status discussed with RN.  EFM Reviewed  A: 23 y.o. G3P1 [redacted]w[redacted]d  Uterine contractions  False labor  P: Discharge orders placed. Nurse instructed to: -Educate patient on labor precautions and fetal movement. -Include in AVS -Inform patient to follow-up with [facility] as scheduled. -Encourage patient to return, to MAU, as needed or with worsening or onset of new symptoms.   Camie DELENA Rote MSN, CNM  02/29/2024 5:35 PM

## 2024-02-29 NOTE — MAU Note (Signed)
 AVALEY COOP is a 23 y.o. at [redacted]w[redacted]d here in MAU reporting: contractions started an hour ago. No bleeding or LOF.  Denies problems with pregnancy  Onset of complaint: hr ago Pain score: 8 Vitals:   02/29/24 1449 02/29/24 1450  BP: 121/65   Pulse: 86   Resp: 16   Temp: (!) 97.5 F (36.4 C)   SpO2:  98%     FHT:140 Lab orders placed from triage:

## 2024-03-03 ENCOUNTER — Ambulatory Visit (INDEPENDENT_AMBULATORY_CARE_PROVIDER_SITE_OTHER): Admitting: Obstetrics and Gynecology

## 2024-03-03 ENCOUNTER — Ambulatory Visit: Admitting: Physical Therapy

## 2024-03-03 VITALS — BP 125/78 | HR 81 | Wt 195.0 lb

## 2024-03-03 DIAGNOSIS — Z3480 Encounter for supervision of other normal pregnancy, unspecified trimester: Secondary | ICD-10-CM | POA: Diagnosis not present

## 2024-03-03 DIAGNOSIS — B951 Streptococcus, group B, as the cause of diseases classified elsewhere: Secondary | ICD-10-CM

## 2024-03-03 DIAGNOSIS — Z3A37 37 weeks gestation of pregnancy: Secondary | ICD-10-CM

## 2024-03-03 DIAGNOSIS — F332 Major depressive disorder, recurrent severe without psychotic features: Secondary | ICD-10-CM

## 2024-03-03 NOTE — Progress Notes (Signed)
   PRENATAL VISIT NOTE  Subjective:  Melissa Navarro is a 23 y.o. G3P1011 at [redacted]w[redacted]d being seen today for ongoing prenatal care.  She is currently monitored for the following issues for this low-risk pregnancy and has MDD (major depressive disorder), recurrent severe, without psychosis (HCC); Attention deficit hyperactivity disorder (ADHD); Borderline personality disorder (HCC); Supervision of other normal pregnancy, antepartum; Trichomonal vaginitis during pregnancy in first trimester; and Positive GBS test on their problem list.  Patient reports contractions since 1 week.  Contractions: Irregular. Vag. Bleeding: None.  Movement: Present. Denies leaking of fluid.   Was seen in MAU recently for contractions.   The following portions of the patient's history were reviewed and updated as appropriate: allergies, current medications, past family history, past medical history, past social history, past surgical history and problem list.   Objective:   Vitals:   03/03/24 1533  BP: 125/78  Pulse: 81  Weight: 195 lb (88.5 kg)    Fetal Status:  Fetal Heart Rate (bpm): 136   Movement: Present    General: Alert, oriented and cooperative. Patient is in no acute distress.  Skin: Skin is warm and dry. No rash noted.   Cardiovascular: Normal heart rate noted  Respiratory: Normal respiratory effort, no problems with respiration noted  Abdomen: Soft, gravid, appropriate for gestational age.  Pain/Pressure: Present     Pelvic: Cervical exam deferred Dilation: 2.5 Effacement (%): 70 Station: -3  Extremities: Normal range of motion.  Edema: None  Mental Status: Normal mood and affect. Normal behavior. Normal judgment and thought content.      09/19/2023    3:50 PM  Depression screen PHQ 2/9  Decreased Interest 2  Down, Depressed, Hopeless 0  PHQ - 2 Score 2  Altered sleeping 2  Tired, decreased energy 1  Change in appetite 1  Feeling bad or failure about yourself  1  Trouble concentrating 2   Moving slowly or fidgety/restless 0  Suicidal thoughts 0  PHQ-9 Score 9      Data saved with a previous flowsheet row definition        09/19/2023    3:50 PM  GAD 7 : Generalized Anxiety Score  Nervous, Anxious, on Edge 1  Control/stop worrying 1  Worry too much - different things 1  Trouble relaxing 1  Restless 1  Easily annoyed or irritable 2  Afraid - awful might happen 3  Total GAD 7 Score 10    Assessment and Plan:  Pregnancy: G3P1011 at [redacted]w[redacted]d  1. Supervision of other normal pregnancy, antepartum (Primary)  Doing well Discussed mile circuit Contractions irregular and painful.  + fetal movement.   2. Positive GBS test  Treat in labor   3. MDD (major depressive disorder), recurrent severe, without psychosis (HCC)  Stable.   Term labor symptoms and general obstetric precautions including but not limited to vaginal bleeding, contractions, leaking of fluid and fetal movement were reviewed in detail with the patient. Please refer to After Visit Summary for other counseling recommendations.   No follow-ups on file.  Future Appointments  Date Time Provider Department Center  03/10/2024 12:30 PM Gretel Celena BROCKS, PT [facility] None  03/10/2024  3:30 PM Adriann Ballweg, Delon FERNS, NP [facility] [place]  03/17/2024  1:10 PM Branae Crail, Delon FERNS, NP [facility]    Delon Emms, NP

## 2024-03-03 NOTE — Patient Instructions (Signed)
 The MilesCircuit  This circuit takes at least 90 minutes to complete so clear your schedule and make mental preparations so you can relax in your environment. The second step requires a lot of pillows so gather them up before beginning Before starting, you should empty your bladder! Have a nice drink nearby, and make sure it has a straw! If you are having contractions, this circuit should be done through contractions, try not to change positions between steps Before you begin...  "I named this 'circuit' after my friend Deneen Harts, who shared and discussed it with me when I was working with a client whose labor seemed to be stalled out and no longer progressing... This circuit is useful to help get the baby lined up, ideally, in the "Left Occiput Anterior" (LOA) Position, both before labor begins and when some corrections need to be done during labor. Prenatally, this position set can help to rotate a baby. As a natural method of induction, this can help get things going if baby just needed a gentle nudge of position to set things off. To the best of my knowledge, this group of positions will not "hurt" a baby that is already lined up correctly." - Sharlyn Bologna   Step One: Open-knee Chest Stay in this position for 30 minutes, start in cat/cow, then drop your chest as low as you can to the bed or the floor and your bottom as high as you can. Knees should be fairly wide apart, and the angle between the torso/thighs should be wider than 90 degrees. Wiggle around, prop with lots of pillows and use this time to get totally relaxed. This position allows the baby to scoot out of the pelvis a bit and gives them room to rotate, shift their head position, etc. If the pregnant person finds it helpful, careful positioning with a rebozo under the belly, with gentle tension from a support person behind can help maintain this position for the full 30 minutes.  Step WGN:[id number] Left Side  Lying Roll to your left side, bringing your top leg as high as possible and keeping your bottom leg straight. Roll forward as much as possible, again using a lot of pillows. Sink into the bed and relax some more. If you fall asleep, that's totally okay and you can stay there! If not, stay here for at least another half an hour. Try and get your top right leg up towards your head and get as rolled over onto your belly as much as possible. If you repeat the circuit during labor, try alternating left and right sides. We know the photo the left is actually right side... just flip the image in your head.  Step Three: Moving and Lunges Lunge, walk stairs facing sideways, 2 at a time, (have a spotter downstairs of you!), take a walk outside with one foot on the curb and the other on the street, sit on a birth ball and hula- anything that's upright and putting your pelvis in open, asymmetrical positions. Spend at least 30 minutes doing this one as well to give your baby a chance to move down. If you are lunging or stair or curb walking, you should lunge/walk/go up stairs in the direction that feels better to you. The key with the lunge is that the toes of the higher leg and mom's belly button should be at right angles. Do not lunge over your knee, that closes the pelvis.     Megan Hamilton Miles: Tourist information centre manager - [email or web address] Jasmine December  Muza, CD, BDT (DONA), LCCE, FACCE: Supporting Content - [email or web address] Rulon Eisenmenger: Photography - [email or web address] Luther Hearing CD/CDT [facility]): Print and Webmaster - [email or web address] MilesCircuit [employer] [email or web address]

## 2024-03-07 ENCOUNTER — Encounter (HOSPITAL_COMMUNITY): Payer: Self-pay | Admitting: Obstetrics & Gynecology

## 2024-03-07 ENCOUNTER — Inpatient Hospital Stay (HOSPITAL_COMMUNITY)
Admission: AD | Admit: 2024-03-07 | Discharge: 2024-03-07 | Disposition: A | Discharge status: Home / Self Care | Attending: Obstetrics & Gynecology | Admitting: Obstetrics & Gynecology

## 2024-03-07 ENCOUNTER — Other Ambulatory Visit: Payer: Self-pay

## 2024-03-07 DIAGNOSIS — O368130 Decreased fetal movements, third trimester, not applicable or unspecified: Secondary | ICD-10-CM | POA: Diagnosis not present

## 2024-03-07 DIAGNOSIS — Z3493 Encounter for supervision of normal pregnancy, unspecified, third trimester: Secondary | ICD-10-CM

## 2024-03-07 DIAGNOSIS — Z3A38 38 weeks gestation of pregnancy: Secondary | ICD-10-CM | POA: Insufficient documentation

## 2024-03-07 DIAGNOSIS — Z3689 Encounter for other specified antenatal screening: Secondary | ICD-10-CM | POA: Diagnosis not present

## 2024-03-07 DIAGNOSIS — O471 False labor at or after 37 completed weeks of gestation: Secondary | ICD-10-CM | POA: Diagnosis present

## 2024-03-07 NOTE — MAU Note (Signed)
 Melissa Navarro is a 23 y.o. at [redacted]w[redacted]d here in MAU reporting: had a lot of ctxs yesterday and on and off ctxs today and feels extra tired today. Feeling less fetal movement than normal. Did not try drinking fluids, eating something sugary or lying on her side and counting kick counts. States he is usually a very active baby but isn't his normal today. Denies any LOF or VB.   Onset of complaint: today Pain score: 7 has a very high pain tolerance  Vitals:   03/07/24 1752  BP: 121/69  Pulse: 94  Resp: 16  Temp: 98 F (36.7 C)  SpO2: 100%     FHT:142 Lab orders placed from triage:  labor eval

## 2024-03-07 NOTE — MAU Provider Note (Signed)
 Chief Complaint:  Decreased Fetal Movement and mucus plug   HPI   None     Melissa Navarro is a 23 y.o. G3P1011 at [redacted]w[redacted]d who presents to maternity admissions reporting decreased fetal movement and occasional contractions.  She reports having a lot of contractions yesterday, but today they have been infrequent and irregular.  She endorses less fetal movement than normal and feeling extra tired.  She did not try drinking fluids, eating something sugary, or doing kick counts before she presented to the MAU.  Denies vaginal bleeding, leaking of fluid.  Pregnancy Course: Receives care at [facility] for [facility] . Prenatal records reviewed.   Past Medical History:  Diagnosis Date   ADHD (attention deficit hyperactivity disorder)    Anemia    Anxiety    Depression    PPD   OB History  Gravida Para Term Preterm AB Living  3 1 1  1 1   SAB IAB Ectopic Multiple Live Births  1   0 1    # Outcome Date GA Lbr Len/2nd Weight Sex Type Anes PTL Lv  3 Current           2 Term 06/29/21 [redacted]w[redacted]d 00:55 / 04:12 2780 g M Vag-Spont None  LIV  1 SAB            Past Surgical History:  Procedure Laterality Date   NO PAST SURGERIES     Family History  Problem Relation Age of Onset   Healthy Mother    Varicose Veins Father    Stomach cancer Maternal Aunt    Stomach cancer Maternal Grandmother    Social History   Tobacco Use   Smoking status: Never   Smokeless tobacco: Never  Vaping Use   Vaping status: Former  Substance Use Topics   Alcohol use: Not Currently   Drug use: Not Currently   No Known Allergies Medications Prior to Admission  Medication Sig Dispense Refill Last Dose/Taking   Ferrous Sulfate (IRON PO) Take 1 tablet by mouth every other day.   03/06/2024   Prenatal Vit-Fe Fumarate-FA (MULTIVITAMIN-PRENATAL) 27-0.8 MG TABS tablet Take 1 tablet by mouth daily at 12 noon.   03/06/2024    I have reviewed patient's Past Medical Hx, Surgical Hx, Family Hx, Social Hx,  medications and allergies.   ROS  Pertinent items noted in HPI and remainder of comprehensive ROS otherwise negative.   PHYSICAL EXAM  Patient Vitals for the past 24 hrs:  BP Temp Temp src Pulse Resp SpO2 Height Weight  03/07/24 1752 121/69 98 F (36.7 C) Oral 94 16 100 % 5' 2 (1.575 m) 90.9 kg    Constitutional: Well-developed, well-nourished female in no acute distress.  HEENT: atraumatic, normocephalic. Neck has normal ROM. EOM intact. Cardiovascular: normal rate & rhythm, warm and well-perfused Respiratory: normal effort, no problems with respiration noted GI: Abd soft, non-tender, non-distended MSK: Extremities nontender, no edema, normal ROM Skin: warm and dry. Acyanotic, no jaundice or pallor. Neurologic: Alert and oriented x 4. No abnormal coordination. Psychiatric: Normal mood. Speech not slurred, not rapid/pressured. Patient is cooperative. GU: no CVA tenderness  Fetal Tracing: Baseline FHR: 130 per minute Fetal heart variability: moderate Fetal Heart Rate accelerations: yes Fetal Heart Rate decelerations: none Fetal Non-stress Test: Category I (reactive) Toco: uterine irritability, one uterine contraction   Labs: No results found for this or any previous visit (from the past 24 hours).  Imaging:  No results found.  MDM & MAU COURSE  MDM: Moderate  MAU Course: -Vital signs within normal limits. -NST reactive. -Patient endorses feeling fetal movement in MAU.  Differential diagnosis considered for decreased fetal movement includes but is not limited to: fetal sleep, poor maternal perception of movement, medications, early gestational age, decreased/increased amniotic fluid volume, maternal position (sitting or standing versus lying), fetal position (anterior position of the fetal spine), anterior placenta, maternal physical activity   Orders Placed This Encounter  Procedures   Discharge patient   No orders of the defined types were placed in this  encounter.   ASSESSMENT   1. Fetal movement present during pregnancy in third trimester   2. NST (non-stress test) reactive   3. [redacted] weeks gestation of pregnancy     PLAN  Discharge home in stable condition with labor precautions.  Discussed fetal movement counts and ways to encourage fetal movement at home.     Allergies as of 03/07/2024   No Known Allergies      Medication List     TAKE these medications    IRON PO Take 1 tablet by mouth every other day.   multivitamin-prenatal 27-0.8 MG Tabs tablet Take 1 tablet by mouth daily at 12 noon.        Joesph DELENA Sear, PA

## 2024-03-07 NOTE — Discharge Instructions (Signed)
 Reasons to return to MAU at [facility]: You begin to have strong, frequent contractions 5 minutes apart or less, each last 1 minute, these have been going on for 1-2 hours, and you cannot walk or talk during them. Your water breaks.  Sometimes it is a big gush of fluid. However, many times it may it may be much more subtle. You should go to the hospital if you have a constant leakage of fluid from your vagina, enough to soak a pad when you are walking around.  You have vaginal bleeding.  It is normal to have a small amount of spotting if your cervix was checked. If you have bleeding requiring the use of a pad, go to the hospital. You don't feel your baby moving like normal.  If you think that you baby's movement is decreased, eat a snack and rest on your left side in a quiet room for one hour. If you have not felt the baby move more than 6 times in an hour GO TO THE HOSPITAL.     What is a fetal movement count?  A fetal movement count  is the number of times that you feel your baby move during a certain amount of time. This may also be called a fetal kick count by some people, but fetal movement count is a more accurate term. Movements can be kicks, flutters, swishes, rolls, or jabs. A fetal movement count is recommended for every pregnant woman. You may be asked to start counting fetal movements as early as week 28 of your pregnancy. Pay attention to when your baby is most active. Your baby has times of activity and times to sleep just like you do! You may notice your baby's sleep and wake cycles. You may also notice things that make your baby move more. You should do a fetal movement count: When your baby is normally most active. At the same time each day. A good time to count movements is while you are resting, after having something to eat and drink.  How do I count fetal movements? Find a quiet, comfortable area. Sit, or lie down on your side. Write down the date,  the start time and stop time, and the number of movements that you felt between those two times. Take this information with you to your health care visits. Write down your start time when you feel the first movement. Count kicks, flutters, swishes, rolls, and jabs. You should feel at least 10 movements. You may stop counting after you have felt 10 movements, or if you have been counting for 2 hours. Write down the stop time. If you do not feel 10 movements in 2 hours, contact your health care provider for further instructions. Your health care provider may want to do additional tests to assess your baby's well-being. Contact a health care provider if: You feel fewer than 10 movements in 2 hours. Your baby is not moving like he or she usually does.

## 2024-03-09 ENCOUNTER — Telehealth: Payer: Self-pay

## 2024-03-09 NOTE — Telephone Encounter (Signed)
 RN received Access Nurse notification with patient reports mucous plug lost and irregular contractions on 03/07/24. Pt reported contractions are not strong enough and are not regular. Pt denied any leaking or bleeding. Pt reported good fetal movement with no other concerns. RN reviewed when to notify provider and or go to MAU. Pt verbalized understanding.  Silvano LELON Piano, RN

## 2024-03-10 ENCOUNTER — Ambulatory Visit: Admitting: Obstetrics and Gynecology

## 2024-03-10 ENCOUNTER — Inpatient Hospital Stay (HOSPITAL_COMMUNITY)
Admission: AD | Admit: 2024-03-10 | Discharge: 2024-03-11 | Disposition: A | Discharge status: Home / Self Care | Attending: Obstetrics & Gynecology | Admitting: Obstetrics & Gynecology

## 2024-03-10 ENCOUNTER — Encounter (HOSPITAL_COMMUNITY): Payer: Self-pay | Admitting: Obstetrics & Gynecology

## 2024-03-10 ENCOUNTER — Ambulatory Visit: Admitting: Physical Therapy

## 2024-03-10 VITALS — BP 123/78 | HR 99 | Wt 198.0 lb

## 2024-03-10 DIAGNOSIS — O479 False labor, unspecified: Secondary | ICD-10-CM

## 2024-03-10 DIAGNOSIS — Z3480 Encounter for supervision of other normal pregnancy, unspecified trimester: Secondary | ICD-10-CM

## 2024-03-10 DIAGNOSIS — O471 False labor at or after 37 completed weeks of gestation: Secondary | ICD-10-CM | POA: Insufficient documentation

## 2024-03-10 DIAGNOSIS — Z3A38 38 weeks gestation of pregnancy: Secondary | ICD-10-CM | POA: Insufficient documentation

## 2024-03-10 NOTE — MAU Note (Signed)
 MAU Labor Triage Note:  .Melissa Navarro is a 23 y.o. at [redacted]w[redacted]d here in MAU reporting: Brought directly to room d/t urge to push  Contractions every: 2 minutes Onset of ctx: 2230 Pain Score: 6  Pain Location: Abdomen  ROM: intact Vaginal Bleeding: bloody show Last SVE: 4cm in office  Labor Pain Management Plan: Undecided  GBS: Positive  Fetal Movement: Reports positive FM FHT: Fetal Heart Rate Mode: External Baseline Rate (A): 160 bpm  Vitals:   03/10/24 2300  BP: 130/81  Pulse: 82  Resp: 19  Temp: 97.6 F (36.4 C)  SpO2: 100%      Lab orders placed from triage: MAU Labor Eval OB Office: Faculty

## 2024-03-10 NOTE — Progress Notes (Signed)
   PRENATAL VISIT NOTE  Subjective:  Melissa Navarro is a 23 y.o. G3P1011 at [redacted]w[redacted]d being seen today for ongoing prenatal care.  She is currently monitored for the following issues for this low-risk pregnancy and has MDD (major depressive disorder), recurrent severe, without psychosis (HCC); Attention deficit hyperactivity disorder (ADHD); Borderline personality disorder (HCC); Supervision of other normal pregnancy, antepartum; Trichomonal vaginitis during pregnancy in first trimester; and Positive GBS test on their problem list.  Patient reports no complaints.  Contractions: Irregular. Vag. Bleeding: None, Bloody Show.  Movement: Present. Denies leaking of fluid.   The following portions of the patient's history were reviewed and updated as appropriate: allergies, current medications, past family history, past medical history, past social history, past surgical history and problem list.   Objective:   Vitals:   03/10/24 1536  BP: 123/78  Pulse: 99  Weight: 198 lb (89.8 kg)    Fetal Status:  Fetal Heart Rate (bpm): 138   Movement: Present    General: Alert, oriented and cooperative. Patient is in no acute distress.  Skin: Skin is warm and dry. No rash noted.   Cardiovascular: Normal heart rate noted  Respiratory: Normal respiratory effort, no problems with respiration noted  Abdomen: Soft, gravid, appropriate for gestational age.  Pain/Pressure: Present     Pelvic: Cervical exam performed in the presence of a chaperone Dilation: 3.5 Effacement (%): 70 Station: -3  Extremities: Normal range of motion.  Edema: Mild pitting, slight indentation  Mental Status: Normal mood and affect. Normal behavior. Normal judgment and thought content.      09/19/2023    3:50 PM  Depression screen PHQ 2/9  Decreased Interest 2  Down, Depressed, Hopeless 0  PHQ - 2 Score 2  Altered sleeping 2  Tired, decreased energy 1  Change in appetite 1  Feeling bad or failure about yourself  1  Trouble  concentrating 2  Moving slowly or fidgety/restless 0  Suicidal thoughts 0  PHQ-9 Score 9      Data saved with a previous flowsheet row definition        09/19/2023    3:50 PM  GAD 7 : Generalized Anxiety Score  Nervous, Anxious, on Edge 1  Control/stop worrying 1  Worry too much - different things 1  Trouble relaxing 1  Restless 1  Easily annoyed or irritable 2  Afraid - awful might happen 3  Total GAD 7 Score 10    Assessment and Plan:  Pregnancy: G3P1011 at [redacted]w[redacted]d  1. Supervision of other normal pregnancy, antepartum (Primary)  Labor precautions Discussed miles cervix.  BP normal   Term labor symptoms and general obstetric precautions including but not limited to vaginal bleeding, contractions, leaking of fluid and fetal movement were reviewed in detail with the patient. Please refer to After Visit Summary for other counseling recommendations.   No follow-ups on file.  Future Appointments  Date Time Provider Department Center  03/17/2024  1:10 PM Starlynn Klinkner, Delon FERNS, NP [facility]    Delon Emms, NP

## 2024-03-11 ENCOUNTER — Other Ambulatory Visit: Payer: Self-pay

## 2024-03-11 ENCOUNTER — Encounter (HOSPITAL_COMMUNITY): Payer: Self-pay | Admitting: Obstetrics & Gynecology

## 2024-03-11 ENCOUNTER — Inpatient Hospital Stay (HOSPITAL_COMMUNITY)
Admission: AD | Admit: 2024-03-11 | Discharge: 2024-03-12 | DRG: 807 | Disposition: A | Discharge status: Home / Self Care | Attending: Obstetrics & Gynecology | Admitting: Obstetrics & Gynecology

## 2024-03-11 DIAGNOSIS — Z3A38 38 weeks gestation of pregnancy: Secondary | ICD-10-CM

## 2024-03-11 DIAGNOSIS — O99820 Streptococcus B carrier state complicating pregnancy: Secondary | ICD-10-CM

## 2024-03-11 DIAGNOSIS — O99824 Streptococcus B carrier state complicating childbirth: Secondary | ICD-10-CM | POA: Diagnosis present

## 2024-03-11 LAB — SYPHILIS: RPR W/REFLEX TO RPR TITER AND TREPONEMAL ANTIBODIES, TRADITIONAL SCREENING AND DIAGNOSIS ALGORITHM: RPR Ser Ql: NONREACTIVE

## 2024-03-11 LAB — CBC
HCT: 35.8 % — ABNORMAL LOW (ref 36.0–46.0)
Hemoglobin: 12.3 g/dL (ref 12.0–15.0)
MCH: 29.8 pg (ref 26.0–34.0)
MCHC: 34.4 g/dL (ref 30.0–36.0)
MCV: 86.7 fL (ref 80.0–100.0)
Platelets: 280 K/uL (ref 150–400)
RBC: 4.13 MIL/uL (ref 3.87–5.11)
RDW: 12.6 % (ref 11.5–15.5)
WBC: 12.7 K/uL — ABNORMAL HIGH (ref 4.0–10.5)
nRBC: 0 % (ref 0.0–0.2)

## 2024-03-11 LAB — TYPE AND SCREEN
ABO/RH(D): O POS
Antibody Screen: NEGATIVE

## 2024-03-11 LAB — HIV ANTIBODY (ROUTINE TESTING W REFLEX): HIV Screen 4th Generation wRfx: NONREACTIVE

## 2024-03-11 MED ORDER — OXYCODONE-ACETAMINOPHEN 5-325 MG PO TABS: 2.0000 | ORAL_TABLET | ORAL | Status: DC | PRN

## 2024-03-11 MED ORDER — IBUPROFEN 600 MG PO TABS
600.0000 mg | ORAL_TABLET | Freq: Four times a day (QID) | ORAL | Status: DC
  Administered 2024-03-11 – 2024-03-12 (×6): 600 mg via ORAL
  Filled 2024-03-11 (×6): qty 1

## 2024-03-11 MED ORDER — ONDANSETRON HCL 4 MG/2ML IJ SOLN: 4.0000 mg | Freq: Four times a day (QID) | INTRAMUSCULAR | Status: DC | PRN

## 2024-03-11 MED ORDER — DIBUCAINE (PERIANAL) 1 % EX OINT: 1.0000 | TOPICAL_OINTMENT | CUTANEOUS | Status: DC | PRN

## 2024-03-11 MED ORDER — TETANUS-DIPHTH-ACELL PERTUSSIS 5-2-15.5 LF-MCG/0.5 IM SUSP: 0.5000 mL | Freq: Once | INTRAMUSCULAR | Status: DC

## 2024-03-11 MED ORDER — SIMETHICONE 80 MG PO CHEW: 80.0000 mg | CHEWABLE_TABLET | ORAL | Status: DC | PRN

## 2024-03-11 MED ORDER — ACETAMINOPHEN 325 MG PO TABS: 650.0000 mg | ORAL_TABLET | ORAL | Status: DC | PRN

## 2024-03-11 MED ORDER — OXYTOCIN-SODIUM CHLORIDE 30-0.9 UT/500ML-% IV SOLN: 2.5000 [IU]/h | INTRAVENOUS | Status: DC

## 2024-03-11 MED ORDER — OXYTOCIN BOLUS FROM INFUSION: 333.0000 mL | Freq: Once | INTRAVENOUS | Status: DC

## 2024-03-11 MED ORDER — OXYCODONE HCL 5 MG PO TABS: 10.0000 mg | ORAL_TABLET | ORAL | Status: DC | PRN

## 2024-03-11 MED ORDER — WITCH HAZEL-GLYCERIN EX PADS: 1.0000 | MEDICATED_PAD | CUTANEOUS | Status: DC | PRN

## 2024-03-11 MED ORDER — OXYTOCIN 10 UNIT/ML IJ SOLN
INTRAMUSCULAR | Status: AC
  Filled 2024-03-11: qty 1

## 2024-03-11 MED ORDER — OXYCODONE HCL 5 MG PO TABS: 5.0000 mg | ORAL_TABLET | ORAL | Status: DC | PRN

## 2024-03-11 MED ORDER — OXYCODONE-ACETAMINOPHEN 5-325 MG PO TABS: 1.0000 | ORAL_TABLET | ORAL | Status: DC | PRN

## 2024-03-11 MED ORDER — LACTATED RINGERS IV SOLN: INTRAVENOUS | Status: DC

## 2024-03-11 MED ORDER — ONDANSETRON HCL 4 MG PO TABS: 4.0000 mg | ORAL_TABLET | ORAL | Status: DC | PRN

## 2024-03-11 MED ORDER — SOD CITRATE-CITRIC ACID 500-334 MG/5ML PO SOLN: 30.0000 mL | ORAL | Status: DC | PRN

## 2024-03-11 MED ORDER — ONDANSETRON HCL 4 MG/2ML IJ SOLN: 4.0000 mg | INTRAMUSCULAR | Status: DC | PRN

## 2024-03-11 MED ORDER — PRENATAL MULTIVITAMIN CH
1.0000 | ORAL_TABLET | Freq: Every day | ORAL | Status: DC
  Administered 2024-03-11 – 2024-03-12 (×2): 1 via ORAL
  Filled 2024-03-11 (×2): qty 1

## 2024-03-11 MED ORDER — BENZOCAINE-MENTHOL 20-0.5 % EX AERO: 1.0000 | INHALATION_SPRAY | CUTANEOUS | Status: DC | PRN

## 2024-03-11 MED ORDER — SENNOSIDES-DOCUSATE SODIUM 8.6-50 MG PO TABS
2.0000 | ORAL_TABLET | Freq: Every day | ORAL | Status: DC
  Administered 2024-03-12: 2 via ORAL
  Filled 2024-03-11: qty 2

## 2024-03-11 MED ORDER — LACTATED RINGERS IV SOLN: 500.0000 mL | INTRAVENOUS | Status: DC | PRN

## 2024-03-11 MED ORDER — ZOLPIDEM TARTRATE 5 MG PO TABS: 5.0000 mg | ORAL_TABLET | Freq: Every evening | ORAL | Status: DC | PRN

## 2024-03-11 MED ORDER — DIPHENHYDRAMINE HCL 25 MG PO CAPS: 25.0000 mg | ORAL_CAPSULE | Freq: Four times a day (QID) | ORAL | Status: DC | PRN

## 2024-03-11 MED ORDER — LIDOCAINE HCL (PF) 1 % IJ SOLN: 30.0000 mL | INTRAMUSCULAR | Status: DC | PRN

## 2024-03-11 MED ORDER — COCONUT OIL OIL: 1.0000 | TOPICAL_OIL | Status: DC | PRN

## 2024-03-11 NOTE — Lactation Note (Signed)
 This note was copied from a baby's chart. Lactation Consultation Note  Patient Name: Melissa Navarro Unijb'd Date: 03/11/2024 Age:22 hours  Reason for consult: Initial assessment;Early term 37-38.6wks  P2, [redacted]w[redacted]d  Initial LC visit to see mother and baby Melissa Melissa Navarro. Mother states baby has been breastfeeding very well. She said baby likes to feed frequently and she denies any discomfort. She has breastfeeding experience with her first child for 3 weeks and stopped abruptly due to infant's breast milk intolerance. She reports engorgement and signs of mastitis when she quit. Mother also reports having copious amount of colostrum leaking around [redacted] weeks gestation with this pregnancy. She is concerned her milk may be gone and wants to start pumping. She reports that she made lots of milk with her first baby.  Mother was receptive to learning hand expression. Colostrum flowed with ease and slight expression. Mother was reassured that she has adequate colostrum for her baby at this time. Praised mother for breastfeeding baby often and with cues. Discouraged mother to pump at this time due to the potential of over production of milk that can lead to engorgement. Mother verbalized understanding. She has a DEBP at home. Requested a hand pump to have as a backup. She denied need for instruction of pump. Reviewed cleaning and storage. She was fitted with a 18 mm flange.  Handout provided of O/P services, breastfeeding support groups, community resources, and phone # for post-discharge questions.        Maternal Data Has patient been taught Hand Expression?: Yes Does the patient have breastfeeding experience prior to this delivery?: Yes How long did the patient breastfeed?: 3 weeks, stopped abruptly due to infant having  a lactose intolerane  Feeding Mother's Current Feeding Choice: Breast Milk  LATCH Score  Baby just fed prior to my arrival  Lactation Tools Discussed/Used Tools:  Pump Flange Size: 18 Breast pump type: Manual Pump Education: Milk Storage;Setup, frequency, and cleaning Reason for Pumping: Mother requested for home use Pumping frequency: PRN  Interventions Interventions: Breast feeding basics reviewed;Hand express;Hand pump;Education;LC Services brochure;CDC milk storage guidelines;CDC Guidelines for Breast Pump Cleaning  Discharge Pump: Personal;DEBP  Consult Status Consult Status: Follow-up Date: 03/12/24 Follow-up type: In-patient    Joshua Line M 03/11/2024, 11:10 AM

## 2024-03-11 NOTE — Discharge Instructions (Signed)
 Reasons to return to MAU at [facility]:  1.  Contractions are  5 minutes apart or less, each last 1 minute, these have been going on for 1-2 hours, and you cannot walk or talk during them 2.  You have a large gush of fluid, or a trickle of fluid that will not stop and you have to wear a pad 3.  You have bleeding that is bright red, heavier than spotting--like menstrual bleeding (spotting can be normal in early labor or after a check of your cervix) 4.  You do not feel the baby moving like he/she normally does   The [employer]uit ([email or web address])  This circuit takes at least 90 minutes to complete so clear your schedule and make mental preparations so you can relax in your environment.  The second step requires a lot of pillows so gather them up before beginning.  Before starting, you should empty your bladder! Have a nice drink nearby, and make sure it has a straw! If you are having contractions, this circuit should be done through contractions, try not to change positions between steps.  Step One: Open Knee Chest  Stay in this position for 30 minutes, start in cat/cow (on your hands and knees), then drop your chest as low as you can to the bed or the floor and your bottom as high as you can. Knees should be fairly wide apart, and the angle between the torso/thighs should be wider than 90 degrees. Wiggle around, prop with lots of pillows and use this time to get totally relaxed. This position allows the baby to scoot out of the pelvis a bit and gives them room to rotate, shift his/her head position, etc. If it is helpful, a support person can provide careful positioning with a rebozo/sheet under the belly, to help maintain this position for the full 30 minutes.   Step Utn:Zkjhhzmjuzi Side Lying  Roll to your left side, bringing your top leg as high as possible and keeping your bottom leg straight. Roll forward as much as possible, again  using a lot of pillows. Sink into the bed and relax some more. If you fall asleep, that's totally okay and you can stay there! If not, stay here for at least another half an hour. Try and get your top right leg up towards your head and get as rolled over onto your belly as much as possible. If you repeat the circuit during labor, try alternating left and right sides.   Step Three: Moving and Lunges  Lunge, walk stairs facing sideways, 2 at a time, (have a spotter downstairs of you!), take a walk outside with one foot on the curb and the other on the street, sit on a birth ball and hula- anything that's upright and putting your pelvis in open, asymmetrical positions. Spend at least 30 minutes doing this one as well to give your baby a chance to move down. If you are lunging or stair or curb walking, you should lunge/walk/go up stairs in the direction that feels better to you. The key with the lunge is to turn the toes out, at a right angle to your belly button.  Do not lunge with toes pointed forward, over your knee, as that closes the pelvis.  Have fun, and relax!

## 2024-03-11 NOTE — MAU Provider Note (Signed)
 Ms. ALSACE DOWD is a [id number] at [redacted]w[redacted]d seen in MAU for labor. RN labor check, not seen by provider.   SVE by RN Dilation: 4.5 Effacement (%): 80 Station: 0 Presentation: Vertex Exam by:: Nat Mule, RN   NST - FHR: 135 bpm / moderate variability / accels present / decels absent NST reactive / TOCO: regular every 3-4 mins   Plan:  D/C home with labor precautions Keep scheduled appt with [facility]  Olam Boards, CNM  03/11/2024 12:20 AM

## 2024-03-11 NOTE — MAU Note (Signed)
 MAU Labor Evaluation RN Medical Screening Exam: RN Assessment:  .Melissa Navarro is a 23 y.o. at [redacted]w[redacted]d here in MAU for evaluation of labor. See RN triage note.   Pain Score: 6  Pain Location: Abdomen  Cervical exam:  Dilation: 4.5 Effacement (%): 80 Station: 0 Presentation: Vertex Exam by:: Nat Mule, RN  Fetal Monitoring: Baseline Rate (A): 135 bpm Variability: Moderate Accelerations: 15 x 15 Decelerations: None Contraction Frequency (min): 3-5  Vitals:   03/10/24 2300  BP: 130/81  Pulse: 82  Resp: 19  Temp: 97.6 F (36.4 C)  SpO2: 100%      Medical Decision Making & Provider Communication:  This RN has communicated with: Provider Name/Title: L. Leftwich-Kirby, CNM  Communicated with MAU provider SBAR report of labor evaluation. Also reviewed contraction pattern and that non-stress test is reactive.  Contraction Frequency (min): 3-5 has been documented with no cervical change over 1 hours not indicating active labor.  Patient denies any other complaints.  Based on this report provider has given order for discharge. A discharge order and diagnosis entered by a provider. Labor discharge precautions reviewed with patient and all questions answered. Patient verbalized understanding.   Plan of Care: Discharge home with strict labor precautions

## 2024-03-11 NOTE — H&P (Signed)
 Melissa Navarro is a 23 y.o. female presenting for SOL. Patient reports contractions every 2-3 mins, and feeling the urge to push. She denies vaginal bleeding leaking of fluid and endorses positive fetal movement.  OB History     Gravida  3   Para  1   Term  1   Preterm      AB  1   Living  1      SAB  1   IAB      Ectopic      Multiple  0   Live Births  1          Past Medical History:  Diagnosis Date   ADHD (attention deficit hyperactivity disorder)    Anemia    Anxiety    Depression    PPD   Past Surgical History:  Procedure Laterality Date   NO PAST SURGERIES     Family History: family history includes Healthy in her mother; Stomach cancer in her maternal aunt and maternal grandmother; Varicose Veins in her father. Social History:  reports that she has never smoked. She has never used smokeless tobacco. She reports that she does not currently use alcohol. She reports that she does not currently use drugs.           NURSING  PROVIDER  Office Location [place] Dating by U/S at 10 wks  [facility] Model Traditional Anatomy U/S Normal  Initiated care at  13wks                 Language  English               LAB RESULTS   Support Person William/FOB Genetics NIPS: LR XY AFP: neg      NT/IT (FT only)        Carrier Screen Horizon: Neg x4 in 2022  Rhogam  O/Positive/-- (06/05 1632) A1C/GTT Early HgbA1C: 5.3% Third trimester 2 hr GTT: Normal   Flu Vaccine        TDaP Vaccine  12/31/2023 Blood Type O/Positive/-- (06/05 1632)  RSV Vaccine   Antibody Negative (06/05 1632)  COVID Vaccine   Rubella 4.98 (06/05 1632)  Feeding Plan breast RPR Non Reactive (06/05 1632)  Contraception none HBsAg Negative (06/05 1632)  Circumcision YES HIV Non Reactive (06/05 1632)  Pediatrician  [facility] HCVAb Non Reactive (06/05 1632)  Prenatal Classes Yes, discussed      BTL Consent   Pap       Diagnosis  Date Value Ref Range Status  09/19/2023     Final    -  Negative for intraepithelial lesion or malignancy (NILM)    BTL Pre-payment   GC/CT Initial:   36wks:    VBAC Consent   GBS For PCN allergy, check sensitivities   BRx Optimized? [ ]  yes   [ ]  no      DME Rx [ ]  BP cuff [ ]  Weight Scale Waterbirth  [ ]  Class [ ]  Consent [ ]  CNM visit  PHQ9 & GAD7 [  ] new OB [  ] 28 weeks  [  ] 36 weeks Induction  [ ]  Orders Entered [ ] Foley Y/N   Review of Systems Maternal Medical History:  Reason for admission: Contractions.   Contractions: Onset was 3-5 hours ago.   Perceived severity is strong.   Fetal activity: Perceived fetal activity is normal.   Prenatal Complications - Diabetes: none.     Last menstrual period 05/31/2023. Maternal Exam:  Uterine Assessment: Contraction strength is firm.  Contraction frequency is regular.  Abdomen: Fetal presentation: vertex Cervix: Cervix evaluated by digital exam.     Physical Exam Vitals and nursing note reviewed.  Constitutional:      General: She is in acute distress.     Appearance: Normal appearance.  HENT:     Head: Normocephalic.  Pulmonary:     Effort: Pulmonary effort is normal.  Abdominal:     Comments: Gravid uterus   Genitourinary:    Comments: 10/100/+2 BBOW S. Navarro CNM  Musculoskeletal:     Cervical back: Normal range of motion.  Skin:    General: Skin is warm and dry.  Neurological:     Mental Status: She is alert and oriented to person, place, and time.  Psychiatric:        Mood and Affect: Mood normal.     Prenatal labs: ABO, Rh: O/Positive/-- (06/05 1632) Antibody: Negative (06/05 1632) Rubella: 4.98 (06/05 1632) RPR: Non Reactive (09/16 0919)  HBsAg: Negative (06/05 1632)  HIV: Non Reactive (09/16 0919)  GBS: Positive/-- (11/11 1620)   Assessment/Plan: Melissa Navarro , a  23 y.o. G3P1011 at [redacted]w[redacted]d presents for SOL and currently involuntarily bearing down   Labor: Expectant Mgmt  FWB: 130 bpm via doppler in MAU Pain: declined epidural  I/D: GBS positive  however delivery imminent. Not treated.  MOF: Breast Circ: Yes  MOC: None  MOD: vaginal birth   Melissa Melissa Emilio, MSN CNM  03/11/2024, 2:49 AM

## 2024-03-11 NOTE — Plan of Care (Signed)

## 2024-03-11 NOTE — Discharge Summary (Signed)
 Postpartum Discharge Summary     Patient Name: Melissa Navarro DOB: 2000-10-14 MRN: [id number]  Date of admission: 03/11/2024 Delivery date:03/11/2024 Delivering provider: MILLY PLANAS A Date of discharge: 03/12/2024  Admitting diagnosis: Normal labor [O80, Z37.9] Intrauterine pregnancy: [redacted]w[redacted]d     Secondary diagnosis:  Active Problems:   SVD (spontaneous vaginal delivery)   Group B Streptococcus carrier, antepartum  Additional problems:  Patient Active Problem List   Diagnosis Date Noted   SVD (spontaneous vaginal delivery) 03/12/2024   Group B Streptococcus carrier, antepartum 03/12/2024   Positive GBS test 02/28/2024   Trichomonal vaginitis during pregnancy in first trimester 09/23/2023   Supervision of other normal pregnancy, antepartum 09/19/2023   Borderline personality disorder (HCC) 05/20/2020   Attention deficit hyperactivity disorder (ADHD) 02/22/2016   MDD (major depressive disorder), recurrent severe, 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 yo [id number] at [redacted]w[redacted]d was admitted in Active Labor on 03/11/2024. Labor course was complicated by Precipitous delivery and positive GBS untreated.   Membrane Rupture Time/Date: 2:36 AM,03/11/2024  Delivery Method:Vaginal, Spontaneous Operative Delivery:N/A Episiotomy: None Lacerations:  None Patient had a postpartum course complicated by: none.  She is ambulating, tolerating a regular diet, passing flatus, and urinating well. Patient is discharged home in stable condition on 03/12/24.  Newborn Data: Birth date:03/11/2024 Birth time:2:44 AM Gender:Female Living status:Living Apgars:8 ,9  Weight:2980 g  Magnesium Sulfate received: No BMZ received: No Rhophylac:N/A MMR:N/A T-DaP:Given  prenatally Flu: No RSV Vaccine received: No Transfusion:No  Immunizations received: Immunization History  Administered Date(s) Administered   Influenza,inj,Quad PF,6+ Mos 02/21/2016   Tdap 05/12/2021, 12/31/2023    Physical exam  Vitals:   03/11/24 1430 03/11/24 1830 03/11/24 2009 03/12/24 0445  BP: 106/75 104/73 125/73 124/71  Pulse: 83 80 80 86  Resp: 18 18 18 18   Temp: 98 F (36.7 C) 98.2 F (36.8 C) 98 F (36.7 C) 98.5 F (36.9 C)  TempSrc: Oral Oral Oral Oral  SpO2: 100% 100% 100% 100%  Weight:      Height:       General: alert, cooperative, and no distress Lochia: appropriate Uterine Fundus: firm Incision: N/A DVT Evaluation: No significant calf/ankle edema. Labs: Lab Results  Component Value Date   WBC 12.7 (H) 03/11/2024   HGB 12.3 03/11/2024   HCT 35.8 (L) 03/11/2024   MCV 86.7 03/11/2024   PLT 280 03/11/2024      Latest Ref Rng & Units 07/19/2021   11:29 AM  CMP  Glucose 70 - 99 mg/dL 891   BUN 6 - 20 mg/dL <5   Creatinine 9.55 - 1.00 mg/dL 9.20   Sodium 864 - 854 mmol/L 142   Potassium 3.5 - 5.1 mmol/L 3.3   Chloride 98 - 111 mmol/L 112   CO2 22 - 32 mmol/L 24   Calcium 8.9 - 10.3 mg/dL 8.8   Total Protein 6.5 - 8.1 g/dL 5.8   Total Bilirubin 0.3 - 1.2 mg/dL 0.5   Alkaline Phos 38 - 126 U/L 72   AST 15 - 41 U/L 14  ALT 0 - 44 U/L 26    Edinburgh Score:    03/11/2024   11:00 AM  Edinburgh Postnatal Depression Scale Screening Tool  I have been able to laugh and see the funny side of things. 0  I have looked forward with enjoyment to things. 0  I have blamed myself unnecessarily when things went wrong. 1  I have been anxious or worried for no good reason. 1  I have felt scared or panicky for no good reason. 1  Things have been getting on top of me. 0  I have been so unhappy that I have had difficulty sleeping. 0  I have felt sad or miserable. 0  I have been so unhappy that I have been crying. 1  The thought of harming myself has  occurred to me. 0  Edinburgh Postnatal Depression Scale Total 4   Edinburgh Postnatal Depression Scale Total: 4   After visit meds:  Allergies as of 03/12/2024   No Known Allergies      Medication List     STOP taking these medications    IRON PO       TAKE these medications    acetaminophen  500 MG tablet Commonly known as: TYLENOL  Take 2 tablets (1,000 mg total) by mouth every 6 (six) hours as needed for mild pain (pain score 1-3) or moderate pain (pain score 4-6).   ibuprofen  600 MG tablet Commonly known as: ADVIL  Take 1 tablet (600 mg total) by mouth every 6 (six) hours.   multivitamin-prenatal 27-0.8 MG Tabs tablet Take 1 tablet by mouth daily at 12 noon.         Discharge home in stable condition Infant Feeding: Breast Infant Disposition:home with mother Discharge instruction: per After Visit Summary and Postpartum booklet. Activity: Advance as tolerated. Pelvic rest for 6 weeks.  Diet: routine diet Future Appointments: Future Appointments  Date Time Provider Department Center  04/07/2024  3:30 PM Leftwich-Kirby, Olam LABOR, CNM [facility]   Follow up Visit:   Please schedule this patient for a Virtual postpartum visit in 4 weeks with the following provider: Any provider. Additional Postpartum F/U:N/A   Low risk pregnancy complicated by: N/A  Delivery mode:  Vaginal, Spontaneous Anticipated Birth Control:  none   Message sent on 03/11/24  03/12/2024 Leeroy KATHEE Pouch, MD

## 2024-03-12 DIAGNOSIS — O99820 Streptococcus B carrier state complicating pregnancy: Secondary | ICD-10-CM

## 2024-03-12 MED ORDER — ACETAMINOPHEN 500 MG PO TABS: 1000.0000 mg | ORAL_TABLET | Freq: Four times a day (QID) | ORAL | 0 refills | Status: AC | PRN

## 2024-03-12 MED ORDER — IBUPROFEN 600 MG PO TABS: 600.0000 mg | ORAL_TABLET | Freq: Four times a day (QID) | ORAL | 0 refills | Status: AC

## 2024-03-12 NOTE — Clinical Social Work Maternal (Signed)
 CLINICAL SOCIAL WORK MATERNAL/CHILD NOTE  Patient Details  Name: Melissa Navarro MRN: [id number] Date of Birth: [birth year]/11/2023  Date:  03/12/2024  Clinical Social Worker Initiating Note:  [place] Date/Time: Initiated:  03/12/24/1052     Child's Name:  Melissa Navarro   Biological Parents:  Mother, Father   Need for Interpreter:  None   Reason for Referral:  Behavioral Health Concerns   Address:  [address] [place] [address]    Phone number:  [phone] (home)     Additional phone number: FOB's number [phone]  Household Members/Support Persons (HM/SP):   Household Member/Support Person 1, Household Member/Support Person 2   HM/SP Name Relationship DOB or Age  HM/SP -1 Melissa Navarro FOB 08/30/1977  HM/SP -2 Melissa Navarro son 06/29/21  HM/SP -3        HM/SP -4        HM/SP -5        HM/SP -6        HM/SP -7        HM/SP -8          Natural Supports (not living in the home):  Extended Family, Parent (Per the couple FOB's family will also provide supports.)   Professional Supports: None   Employment: Consulting Civil Engineer   Type of Work:     Education:  Some Materials Engineer arranged:    Surveyor, Quantity Resources:  [employer]   Other Resources:  Sales Executive  , WIC   Cultural/Religious Considerations Which May Impact Care:  none reported  Strengths:  Ability to meet basic needs  , Merchandiser, retail, Home prepared for child  , Understanding of illness   Psychotropic Medications:         Pediatrician:    [employer] Oper[employer] area  Pediatrician List:   [facility]  [place]    [place]      Pediatrician Fax Number:    Risk Factors/Current Problems:  Mental Health Concerns     Cognitive State:  Alert  , Insightful  , Goal Oriented  , Linear Thinking  , Able to Concentrate     Mood/Affect:  Relaxed  , Interested  , Happy  , Calm  , Comfortable     CSW  Assessment: CSW meet with MOB in room 507 to complete an assessment for hx of anx/dep  and Borderline Personality Disorder.   When CSW arrived, MOB was on the couch observing infant and FOB's interactions; the family appeared happy and comfortable. MOB was inviting and polite. MOB gave CSW verbal consent to complete assessment while FOB was present.  FOB  engaged with CSW while attending to infant's cues. CSW inquired about MOB's MH hx and MOB acknowledged a dx of anx/dep and reported she was dx while in middles school. CSW asked about MOB's dx of Borderline Personality Disorder and MOB stated, I don't think that is a true diagnosis and I have not had any symptoms since I was diagnosed. MOB reported that she is  not on a medication regiment and she is currently not in therapy.  MOB declined all MH resources however, was open to information pertaining to PMADs. CSW educated MOB about PPD. CSW informed MOB of possible supports and interventions to decrease PPD.  CSW also encouraged MOB to seek medical attention if needed for increased signs and symptoms of PPD.  When CSW assessed for safety MOB  denied SI and HI. CSW reviewed safe sleep and SIDS. MOB  and FOB were knowledgeable and asked appropriate questions. MOB communicated that the family has everything they need for the baby and is prepared to meet their infant's needs.  The couple reported having a new car seat and bassinet. MOB did not have any further questions, concerns, or needs at this time, and CSW thanked couple for allowing CSW to meet with them.  There are no barriers to discharge.   CSW Plan/Description:  Psychosocial Support and Ongoing Assessment of Needs, Sudden Infant Death Syndrome (SIDS) Education, Perinatal Mood and Anxiety Disorder (PMADs) Education, Other Patient/Family Education, Other Information/Referral to [employer], MSW, [employer] Social Work [phone]

## 2024-03-17 ENCOUNTER — Encounter: Admitting: Obstetrics and Gynecology

## 2024-03-21 ENCOUNTER — Telehealth (HOSPITAL_COMMUNITY): Payer: Self-pay

## 2024-03-21 NOTE — Telephone Encounter (Signed)
 03/21/2024 1415  Name: Melissa Navarro MRN: [id number] DOB: 12/02/2000  Reason for Call:  Transition of Care Hospital Discharge Call  Contact Status: Patient Contact Status: Complete  Language assistant needed:          Follow-Up Questions: Do You Have Any Concerns About Your Health As You Heal From Delivery?: Yes What Concerns Do You Have About Your Health?: Patient states that she has some swelling in her lower legs and feet that has been uncomfortable. She states that swelling is improving with rest and elevating legs. RN asked patient if she has checked her BP at all and she states that she has not but has a cuff and plans to. RN explained preeclampsia and warning signs. RN told patient if she has any warning signs or elevated BP to reach out to her provider. RN did explain that sometimes some swelling in feet and lower legs is common after delivery due to fluid shifts and will resolve within a few weeks. Patient has no other questions or concerns. Do You Have Any Concerns About Your Infants Health?: No  Edinburgh Postnatal Depression Scale:  In the Past 7 Days: I have been able to laugh and see the funny side of things.: As much as I always could I have looked forward with enjoyment to things.: As much as I ever did I have blamed myself unnecessarily when things went wrong.: Yes, some of the time I have been anxious or worried for no good reason.: Hardly ever I have felt scared or panicky for no good reason.: No, not much Things have been getting on top of me.: No, most of the time I have coped quite well I have been so unhappy that I have had difficulty sleeping.: Not at all I have felt sad or miserable.: No, not at all I have been so unhappy that I have been crying.: Only occasionally The thought of harming myself has occurred to me.: Never Edinburgh Postnatal Depression Scale Total: 6  PHQ2-9 Depression Scale:     Discharge Follow-up: Edinburgh score requires follow up?:  No Patient was advised of the following resources:: Breastfeeding Support Group, Support Group Patient referred to:: OB  Post-discharge interventions: Reviewed Newborn Safe Sleep Practices  Signature  Rosaline Deretha PEAK

## 2024-04-07 ENCOUNTER — Ambulatory Visit: Admitting: Advanced Practice Midwife

## 2024-04-24 ENCOUNTER — Ambulatory Visit: Admitting: Certified Nurse Midwife

## 2024-04-24 DIAGNOSIS — Z30017 Encounter for initial prescription of implantable subdermal contraceptive: Secondary | ICD-10-CM

## 2024-04-24 DIAGNOSIS — Z3202 Encounter for pregnancy test, result negative: Secondary | ICD-10-CM | POA: Diagnosis not present

## 2024-04-24 LAB — POCT URINE PREGNANCY: Preg Test, Ur: NEGATIVE

## 2024-04-24 MED ORDER — ETONOGESTREL 68 MG ~~LOC~~ IMPL
68.0000 mg | DRUG_IMPLANT | Freq: Once | SUBCUTANEOUS | Status: AC
  Administered 2024-04-24: 68 mg via SUBCUTANEOUS

## 2024-04-24 NOTE — Progress Notes (Signed)
 "   Post Partum Visit Note  Melissa Navarro is a 24 y.o. G5P2012 female who presents for a postpartum visit. She is 6 weeks postpartum following a normal spontaneous vaginal delivery.  I have fully reviewed the prenatal and intrapartum course. The delivery was at 38.4 gestational weeks.  Anesthesia: none. Postpartum course has been benign. Baby is doing well.Melissa Navarro is feeding by breast. Bleeding no bleeding. Bowel function is normal. Bladder function is normal. Patient is sexually active. Contraception method is Nexplanon . Postpartum depression screening: negative.   The pregnancy intention screening data noted above was reviewed. Potential methods of contraception were discussed. The patient elected to proceed with No data recorded.    Health Maintenance Due  Topic Date Due   HPV VACCINES (1 - 3-dose series) Never done   Meningococcal B Vaccine (1 of 2 - Standard) Never done   Hepatitis B Vaccines 19-59 Average Risk (1 of 3 - 19+ 3-dose series) Never done   Influenza Vaccine  11/15/2023   COVID-19 Vaccine (1 - 2025-26 season) Never done    The following portions of the patient's history were reviewed and updated as appropriate: allergies, current medications, past family history, past medical history, past social history, past surgical history, and problem list.  Review of Systems Pertinent items are noted in HPI.  Objective:  BP 117/76   Pulse 84   Ht 5' 2 (1.575 m)   Wt 184 lb (83.5 kg)   LMP 05/31/2023 (Approximate)   Breastfeeding Yes   BMI 33.65 kg/m    General:  alert, cooperative, appears stated age, and no distress   Breasts:  not indicated  Lungs: clear to auscultation bilaterally  Heart:  regular rate and rhythm, S1, S2 normal, no murmur, click, rub or gallop  Abdomen: soft, non-tender; bowel sounds normal; no masses,  no organomegaly   Wound NA  GU exam:  not indicated       Assessment:    1. Postpartum care and examination - Benign exam. -- Doing well.    2. Encounter for initial prescription of Nexplanon  GYNECOLOGY CLINIC PROCEDURE NOTE  Ms. Melissa Navarro is a 24 y.o. [id number] here for Nexplanon  insertion. No GYN concerns.  Last pap smear was on 09/19/23 and was normal.  No other gynecologic concerns.  Nexplanon  Insertion Procedure Patient was given informed consent, she signed consent form.  Patient does understand that irregular bleeding is a very common side effect of this medication. She was advised to have backup contraception for one week after placement. Pregnancy test in clinic today was negative.  Appropriate time out taken.  Patient's left arm was prepped and draped in the usual sterile fashion.. The ruler used to measure and mark insertion area.  Patient was prepped with alcohol swab and then injected with 3 ml of 1% lidocaine .  She was prepped with betadine, Nexplanon  removed from packaging,  Device confirmed in needle, then inserted full length of needle and withdrawn per handbook instructions. Nexplanon  was able to palpated in the patient's arm; patient palpated the insert herself. There was minimal blood loss.  Patient insertion site covered with guaze and a pressure bandage to reduce any bruising.  The patient tolerated the procedure well and was given post procedure instructions.    Benign postpartum exam.   Plan:   Essential components of care per ACOG recommendations:  1.  Mood and well being: Patient with negative depression screening today. Reviewed local resources for support.  - Patient tobacco use? No.   -  hx of drug use? No.    2. Infant care and feeding:  -Patient currently breastmilk feeding? Yes. Discussed returning to work and pumping. Reviewed importance of draining breast regularly to support lactation.  -Social determinants of health (SDOH) reviewed in EPIC. No concerns  3. Sexuality, contraception and birth spacing - Patient does not want a pregnancy in the next year.  Desired family size is 3 children.  -  Reviewed reproductive life planning. Reviewed contraceptive methods based on pt preferences and effectiveness.  Patient desired Hormonal Implant today.   - Discussed birth spacing of 18 months  4. Sleep and fatigue -Encouraged family/partner/community support of 4 hrs of uninterrupted sleep to help with mood and fatigue  5. Physical Recovery  - Discussed patients delivery and complications. She describes her labor as good. - Patient had a Vaginal, no problems at delivery. Patient had a 1st degree laceration. Perineal healing reviewed. Patient expressed understanding - Patient has urinary incontinence? No. - Patient is safe to resume physical and sexual activity  6.  Health Maintenance - HM due items addressed Yes - Last pap smear  Diagnosis  Date Value Ref Range Status  09/19/2023   Final   - Negative for intraepithelial lesion or malignancy (NILM)   Pap smear not done at today's visit.  -Breast Cancer screening indicated? No.   7. Chronic Disease/Pregnancy Condition follow up: None  - PCP follow up  Camie DELENA Rote, CNM [facility] [employer]  "
